# Patient Record
Sex: Male | Born: 1975 | Race: White | Hispanic: No | Marital: Married | Smoking: Former smoker
Health system: Southern US, Community
[De-identification: ages and names within clinical notes are randomized; demographics above are authoritative.]

## PROBLEM LIST (undated history)

## (undated) DIAGNOSIS — J309 Allergic rhinitis, unspecified: Secondary | ICD-10-CM

## (undated) DIAGNOSIS — I10 Essential (primary) hypertension: Secondary | ICD-10-CM

## (undated) DIAGNOSIS — J45909 Unspecified asthma, uncomplicated: Secondary | ICD-10-CM

## (undated) HISTORY — DX: Unspecified asthma, uncomplicated: J45.909

## (undated) HISTORY — DX: Allergic rhinitis, unspecified: J30.9

## (undated) HISTORY — DX: Essential (primary) hypertension: I10

## (undated) HISTORY — PX: WISDOM TOOTH EXTRACTION: SHX21

---

## 2001-11-10 ENCOUNTER — Ambulatory Visit (HOSPITAL_COMMUNITY): Admission: RE | Admit: 2001-11-10 | Discharge: 2001-11-10 | Payer: Self-pay | Admitting: Oral Surgery

## 2007-07-25 ENCOUNTER — Emergency Department (HOSPITAL_COMMUNITY): Admission: EM | Admit: 2007-07-25 | Discharge: 2007-07-25 | Payer: Self-pay | Admitting: Emergency Medicine

## 2010-09-20 NOTE — Op Note (Signed)
[place]  Patient:    Benjamin Dalton, Benjamin Dalton Visit Number: [id number] MRN: [id number]          Service Type: DSU Location: [facility] 2864 [id number] Attending Physician:  Retia Passe Dictated by:   Vania Rea. Warren Danes, D.D.S. Proc. Date: 11/10/01 Admit Date:  11/10/2001 Discharge Date: 11/10/2001                             Operative Report  PREOPERATIVE DIAGNOSIS: 1. Maxillary and mandibular impacted third molar teeth #1, 16, 17, and 32. 2. History of multiple severe drug allergies.  POSTOPERATIVE DIAGNOSES: 1. Maxillary and mandibular impacted third molar teeth #1, 16, 17, and 32. 2. History of multiple severe drug allergies.  PROCEDURE:  Removal of third molar teeth #1, 16, 17, and 32.  ANESTHESIA:  General via oroendotracheal intubation.  ESTIMATED BLOOD LOSS:  Less than 50 cc.  FLUID REPLACEMENT:  Approximately 1000 cc of crystalloid solution.  COMPLICATIONS:  None apparent.  INDICATION FOR PROCEDURE:  Mr. Vallin is a 35 year old gentleman who was referred to my office for removal of his impacted third molar teeth.  The patient presented with a history of multiple severe drug allergies, including anaphylactic reactions to TYLENOL, IBUPROFEN, ASPIRIN, NAPROSYN, and OTHER MEDICATIONS.  Due to this history it was recommended that the teeth be removed under general anesthesia with intubation where the airway could be maintained and allergic reactions reduced.  DESCRIPTION OF PROCEDURE:  On November 10, 2001, Mr. Coven was taken to [facility] major operating suite, where he was placed on the operating room table in a supine position.  Following successful oroendotracheal intubation and general anesthesia, the patients face, neck, and oral cavity were prepped and draped in the usual sterile operating room fashion.  The hypopharynx was suctioned free of fluids and secretions and a moistened two-inch vaginal pack was placed as a throat pack.   Attention was then directed intraorally, where approximately 10 cc of 0.5% Xylocaine containing 1:200,000 epinephrine was infiltrated in the maxillary right and left posterior superior alveolar nerve distributions, the corresponding palatal soft tissues, and the right and left inferior alveolar neurovascular regions.  Attention was then directed to the maxillary right arch, where a 1.5 curvilinear incision was made through the mucoperiosteal tissues over the cortical bone of tooth #1.  The soft tissues were reflected laterally for a distance of approximately 1 cm, exposing the cortical bone.  The cortical bone was then reduced using a Stryker rotary osteotome, and the embedded tooth was identified.  The tooth was then sectioned using a Stryker rotary osteotome and a straight Fisher bur and the tooth subluxated from the alveolus and removed from the oral cavity using an 11A elevator and rongeurs forceps.  Surrounding dental follicular tissue was curetted and removed.  The bony margins were smoothed with a small osseous file.  The area was then irrigated with sterile saline irrigating solution and the mucoperiosteal margins approximated and sutured in an interrupted fashion using 4-0 chromic suture material on an FS2 needle.  In a similar fashion, the remaining third molar teeth were removed as well.  The throat pack was removed and the hypopharynx suctioned free of fluids and secretions.  Mr. Kabler was allowed to awaken from the anesthesia and taken to the recovery room, where he tolerated the procedure well without apparent complication. Dictated by:   Vania Rea. Warren Danes, D.D.S. Attending Physician:  Retia Passe DD:  11/17/01 TD:  11/20/01 Job: [id number]

## 2011-01-27 LAB — URINALYSIS, ROUTINE W REFLEX MICROSCOPIC
Bilirubin Urine: NEGATIVE
Nitrite: NEGATIVE
Specific Gravity, Urine: 1.019
pH: 6

## 2011-01-27 LAB — CBC
Hemoglobin: 16.9
MCHC: 34.5
RBC: 5.66
WBC: 11.5 — ABNORMAL HIGH

## 2011-01-27 LAB — DIFFERENTIAL
Lymphocytes Relative: 20
Lymphs Abs: 2.2
Monocytes Absolute: 0.5
Monocytes Relative: 5
Neutro Abs: 8.5 — ABNORMAL HIGH

## 2011-01-27 LAB — POCT I-STAT CREATININE: Creatinine, Ser: 0.7

## 2016-08-08 DIAGNOSIS — I10 Essential (primary) hypertension: Secondary | ICD-10-CM | POA: Diagnosis not present

## 2016-08-08 DIAGNOSIS — E782 Mixed hyperlipidemia: Secondary | ICD-10-CM | POA: Diagnosis not present

## 2016-08-23 DIAGNOSIS — Z0001 Encounter for general adult medical examination with abnormal findings: Secondary | ICD-10-CM | POA: Diagnosis not present

## 2017-03-07 DIAGNOSIS — E782 Mixed hyperlipidemia: Secondary | ICD-10-CM | POA: Diagnosis not present

## 2017-03-07 DIAGNOSIS — I10 Essential (primary) hypertension: Secondary | ICD-10-CM | POA: Diagnosis not present

## 2017-09-04 DIAGNOSIS — E782 Mixed hyperlipidemia: Secondary | ICD-10-CM | POA: Diagnosis not present

## 2017-09-04 DIAGNOSIS — I10 Essential (primary) hypertension: Secondary | ICD-10-CM | POA: Diagnosis not present

## 2017-09-04 DIAGNOSIS — Z0001 Encounter for general adult medical examination with abnormal findings: Secondary | ICD-10-CM | POA: Diagnosis not present

## 2017-09-11 DIAGNOSIS — Z0001 Encounter for general adult medical examination with abnormal findings: Secondary | ICD-10-CM | POA: Diagnosis not present

## 2017-09-11 DIAGNOSIS — E782 Mixed hyperlipidemia: Secondary | ICD-10-CM | POA: Diagnosis not present

## 2017-09-11 DIAGNOSIS — I10 Essential (primary) hypertension: Secondary | ICD-10-CM | POA: Diagnosis not present

## 2017-09-11 DIAGNOSIS — Z6830 Body mass index (BMI) 30.0-30.9, adult: Secondary | ICD-10-CM | POA: Diagnosis not present

## 2018-05-14 DIAGNOSIS — E782 Mixed hyperlipidemia: Secondary | ICD-10-CM | POA: Diagnosis not present

## 2018-05-14 DIAGNOSIS — K7581 Nonalcoholic steatohepatitis (NASH): Secondary | ICD-10-CM | POA: Diagnosis not present

## 2018-05-14 DIAGNOSIS — I10 Essential (primary) hypertension: Secondary | ICD-10-CM | POA: Diagnosis not present

## 2018-05-27 DIAGNOSIS — Z0001 Encounter for general adult medical examination with abnormal findings: Secondary | ICD-10-CM | POA: Diagnosis not present

## 2018-05-27 DIAGNOSIS — I10 Essential (primary) hypertension: Secondary | ICD-10-CM | POA: Diagnosis not present

## 2018-05-27 DIAGNOSIS — E782 Mixed hyperlipidemia: Secondary | ICD-10-CM | POA: Diagnosis not present

## 2019-02-24 DIAGNOSIS — J329 Chronic sinusitis, unspecified: Secondary | ICD-10-CM | POA: Diagnosis not present

## 2019-02-24 DIAGNOSIS — Z6841 Body Mass Index (BMI) 40.0 and over, adult: Secondary | ICD-10-CM | POA: Diagnosis not present

## 2019-02-24 DIAGNOSIS — J45901 Unspecified asthma with (acute) exacerbation: Secondary | ICD-10-CM | POA: Diagnosis not present

## 2019-03-02 DIAGNOSIS — E782 Mixed hyperlipidemia: Secondary | ICD-10-CM | POA: Diagnosis not present

## 2019-03-02 DIAGNOSIS — Z6831 Body mass index (BMI) 31.0-31.9, adult: Secondary | ICD-10-CM | POA: Diagnosis not present

## 2019-03-02 DIAGNOSIS — I10 Essential (primary) hypertension: Secondary | ICD-10-CM | POA: Diagnosis not present

## 2019-03-22 DIAGNOSIS — Z20828 Contact with and (suspected) exposure to other viral communicable diseases: Secondary | ICD-10-CM | POA: Diagnosis not present

## 2019-03-22 DIAGNOSIS — J069 Acute upper respiratory infection, unspecified: Secondary | ICD-10-CM | POA: Diagnosis not present

## 2019-03-22 DIAGNOSIS — U071 COVID-19: Secondary | ICD-10-CM | POA: Diagnosis not present

## 2019-04-04 DIAGNOSIS — J45909 Unspecified asthma, uncomplicated: Secondary | ICD-10-CM | POA: Diagnosis not present

## 2019-04-04 DIAGNOSIS — J019 Acute sinusitis, unspecified: Secondary | ICD-10-CM | POA: Diagnosis not present

## 2019-06-13 DIAGNOSIS — J069 Acute upper respiratory infection, unspecified: Secondary | ICD-10-CM | POA: Diagnosis not present

## 2019-06-13 DIAGNOSIS — J209 Acute bronchitis, unspecified: Secondary | ICD-10-CM | POA: Diagnosis not present

## 2019-06-13 DIAGNOSIS — Z20828 Contact with and (suspected) exposure to other viral communicable diseases: Secondary | ICD-10-CM | POA: Diagnosis not present

## 2019-06-20 DIAGNOSIS — Z20828 Contact with and (suspected) exposure to other viral communicable diseases: Secondary | ICD-10-CM | POA: Diagnosis not present

## 2019-10-26 ENCOUNTER — Ambulatory Visit: Payer: 59 | Admitting: Allergy & Immunology

## 2020-03-07 ENCOUNTER — Encounter: Payer: Self-pay | Admitting: Allergy & Immunology

## 2020-03-07 ENCOUNTER — Ambulatory Visit (INDEPENDENT_AMBULATORY_CARE_PROVIDER_SITE_OTHER): Payer: 59 | Admitting: Allergy & Immunology

## 2020-03-07 ENCOUNTER — Other Ambulatory Visit: Payer: Self-pay

## 2020-03-07 VITALS — BP 150/100 | HR 81 | Temp 98.7°F | Resp 20 | Ht 77.0 in | Wt 248.4 lb

## 2020-03-07 DIAGNOSIS — J4550 Severe persistent asthma, uncomplicated: Secondary | ICD-10-CM

## 2020-03-07 DIAGNOSIS — J310 Chronic rhinitis: Secondary | ICD-10-CM

## 2020-03-07 MED ORDER — TRELEGY ELLIPTA 100-62.5-25 MCG/INH IN AEPB: 1.0000 | INHALATION_SPRAY | Freq: Every day | RESPIRATORY_TRACT | 5 refills | Status: DC

## 2020-03-07 MED ORDER — BENRALIZUMAB 30 MG/ML ~~LOC~~ SOSY
30.0000 mg | PREFILLED_SYRINGE | SUBCUTANEOUS | Status: AC
  Administered 2020-03-07: 30 mg via SUBCUTANEOUS

## 2020-03-07 MED ORDER — AZELASTINE HCL 0.1 % NA SOLN: NASAL | 5 refills | Status: DC

## 2020-03-07 MED ORDER — ARNUITY ELLIPTA 200 MCG/ACT IN AEPB: 2.0000 | INHALATION_SPRAY | Freq: Two times a day (BID) | RESPIRATORY_TRACT | 5 refills | Status: DC

## 2020-03-07 NOTE — Progress Notes (Signed)
Immunotherapy   Patient Details  Name: Benjamin Dalton MRN: [id number] Date of Birth: [birth year]  03/07/2020  Benjamin Dalton was given a sample Fasenra in office today. Patient waited 30 minutes in office post injection with no local reaction or systemic symptoms. Auvi-Q prescription and training provided.  Consent signed and patient instructions given.   Exie Parody 03/07/2020, 4:28 PM

## 2020-03-07 NOTE — Progress Notes (Signed)
NEW PATIENT  Date of Service/Encounter:  03/08/20  Referring provider: Richardean Dalton, Benjamin G, MD   Assessment:   Severe persistent asthma with acute exacerbation - started on Fasenra today  Elevated AEC (700)   Chronic rhinitis  Poor perceiver   Plan/Recommendations:   1. Severe persistent asthma, uncomplicated - Lung testing was in the 30% range, but it did improve with the albuterol treatments. - We are going to get you started on Fasenra, which is a medication that targets eosinophils. - It can decrease asthma exacerbations and improve lung function. - It is not approved to be used alone, so continue with the Trelegy. - We going to get some labs to look for serious causes of difficult to control asthma. - Daily controller medication(s): Singulair 10mg  daily and Trelegy 100/62.5/25 one puff once daily - Prior to physical activity: albuterol 2 puffs 10-15 minutes before physical activity. - Rescue medications: albuterol 4 puffs every 4-6 hours as needed or albuterol nebulizer one vial every 4-6 hours as needed - Changes during respiratory infections or worsening symptoms: Add on Arnuity 200mcg 2 puffs twice daily for TWO WEEKS. - Asthma control goals:  * Full participation in all desired activities (may need albuterol before activity) * Albuterol use two time or less a week on average (not counting use with activity) * Cough interfering with sleep two time or less a month * Oral steroids no more than once a year * No hospitalizations  2. Chronic rhinitis - We are going to get an environmental allergy test via the blood. - We we will call you in 1 to 2 weeks for the results of the test. - In the meantime, continue with fluticasone 1 spray per nostril daily as well as cetirizine 10 mg daily. - Add on Astelin 1 spray per nostril up to twice daily as needed on particularly bad days (this is a nasal form of cetirizine). - We discussed other methods of controlling to get the results  back.  3. Return in about 4 weeks (around 04/04/2020).   Subjective:   Benjamin Dalton is a 44 y.o. male presenting today for evaluation of  Chief Complaint  Patient presents with  . Asthma  . Allergic Reaction    cough,sneezing,watery eyes, congestion     Benjamin Dalton has a history of the following: Patient Active Problem List   Diagnosis Date Noted  . Severe persistent asthma, uncomplicated 03/08/2020  . Chronic rhinitis 03/08/2020    History obtained from: chart review and patient.  Benjamin Dalton was referred by Benjamin Dalton, Benjamin G, MD.     Benjamin Dalton is a 44 y.o. male presenting for an evaluation of allergies and asthma.   Asthma/Respiratory Symptom History: He has been treating it with Zyrtec, Flonase, Singulair, and albuterol as needed. He also has been using Trelegy one puff once daily. He has been on Trelegy one puff once daily for 6 months. His breathing did not seem to make much of a difference. He has been to the PCP 8 times and he gets antibiotics and steroids with transient improvement. He has never been admitted to the hospital.   Allergic Rhinitis Symptom History: He reports congestion, watery eyes and nose. He also endorses some SOB. It does not completely wipe it out and it keeps coming back. He started having symptoms around 18 months ago. He did not really have symptoms as a kiddo, but maybe some pollen allergies. He would have spring allergies and he would just have some itching and eye  watering. But this is the first time that he has had allergies where a cough is involved. He did not have any relief over the winter.   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 Prior to that, he had worked for [employer] for several years. But it did not get better since changing to a different facility. Symptoms do not seem to change environments at all. He is now working at [employer].   Otherwise, there is no history of other atopic diseases, including food allergies, drug allergies, stinging insect allergies, eczema, urticaria or contact dermatitis. There is no significant infectious history. Vaccinations are up to date.    Past Medical History: Patient Active Problem List   Diagnosis Date Noted  . Severe persistent asthma, uncomplicated 03/08/2020  . Chronic rhinitis 03/08/2020    Medication List:  Allergies as of 03/07/2020   Not on File     Medication List       Accurate as of March 07, 2020 11:59 PM. If you have any questions, ask your nurse or doctor.        STOP taking these medications   Trelegy Ellipta 200-62.5-25 MCG/INH Aepb Generic drug: Fluticasone-Umeclidin-Vilant Replaced by: Trelegy Ellipta 100-62.5-25 MCG/INH Aepb Stopped by: Alfonse Spruce, MD     TAKE these medications   albuterol 108 (90 Base) MCG/ACT inhaler Commonly known as: VENTOLIN HFA Inhale into the lungs every 6 (six) hours as needed for wheezing or shortness of breath.   Arnuity Ellipta 200 MCG/ACT Aepb Generic drug: Fluticasone Furoate Inhale 2 puffs into the lungs in the morning and at bedtime. Started by: Alfonse Spruce, MD   azelastine 0.1 % nasal spray Commonly known as: ASTELIN 1 spray in each nostril 1-2 times daily as needed. Started by: Alfonse Spruce, MD   EPINEPHrine 0.3 mg/0.3 mL Soaj injection Commonly known as: Auvi-Q Inject 0.3 mg into the muscle as needed for anaphylaxis. Started by: Alfonse Spruce, MD   fexofenadine 180 MG tablet Commonly known as: ALLEGRA Take 180 mg by mouth daily.   fluticasone 50 MCG/ACT nasal spray Commonly known as: FLONASE Place into both nostrils daily.   ipratropium 0.06 % nasal spray Commonly known as: ATROVENT Place into both nostrils.   losartan-hydrochlorothiazide 100-12.5 MG tablet Commonly known as: HYZAAR Take 1 tablet by mouth daily.   montelukast 10 MG tablet Commonly  known as: SINGULAIR Take 10 mg by mouth at bedtime.   Trelegy Ellipta 100-62.5-25 MCG/INH Aepb Generic drug: Fluticasone-Umeclidin-Vilant Inhale 1 puff into the lungs daily. Replaces: Trelegy Ellipta 200-62.5-25 MCG/INH Aepb Started by: Alfonse Spruce, MD       Birth History: non-contributory  Developmental History: non-contributory  Past Surgical History: Past Surgical History:  Procedure Laterality Date  . WISDOM TOOTH EXTRACTION       Family History: History reviewed. No pertinent family history.   Social History: Terron lives at home with his family. He lives in a house that is 44 years old. There is hardwood throughout the home. They have a combination of gas and electric heating. There is a cat and a dog in the home. There are no dust mite coverings on the bedding. There is no tobacco exposure at all. He current works for the past 20 years. He works on Sales promotion account executive. He worked at [employer] and now works at [employer] in [place].    Review of  Systems  Constitutional: Negative.  Negative for chills, fever, malaise/fatigue and weight loss.  HENT: Positive for congestion. Negative for ear discharge, ear pain and sinus pain.   Eyes: Negative for pain, discharge and redness.  Respiratory: Positive for cough, shortness of breath and wheezing. Negative for sputum production.   Cardiovascular: Negative.  Negative for chest pain and palpitations.  Gastrointestinal: Negative for abdominal pain, constipation, diarrhea, heartburn, nausea and vomiting.  Skin: Negative.  Negative for itching and rash.  Neurological: Negative for dizziness and headaches.  Endo/Heme/Allergies: Positive for environmental allergies. Does not bruise/bleed easily.  All other systems reviewed and are negative.      Objective:   Blood pressure (!) 150/100, pulse 81, temperature 98.7 F (37.1 C), temperature source Temporal, resp. rate 20, height 6\' 5"  (1.956 m), weight 248 lb 6.4 oz (112.7 kg),  SpO2 93 %. Body mass index is 29.46 kg/m.   Physical Exam:   Physical Exam Constitutional:      Appearance: Normal appearance. He is well-developed.  HENT:     Head: Normocephalic and atraumatic.     Right Ear: Tympanic membrane, ear canal and external ear normal. No drainage, swelling or tenderness. Tympanic membrane is not injected, scarred, erythematous, retracted or bulging.     Left Ear: Tympanic membrane, ear canal and external ear normal. No drainage, swelling or tenderness. Tympanic membrane is not injected, scarred, erythematous, retracted or bulging.     Nose: No nasal deformity, septal deviation, mucosal edema or rhinorrhea.     Right Turbinates: Enlarged, swollen and pale.     Left Turbinates: Enlarged, swollen and pale.     Right Sinus: No maxillary sinus tenderness or frontal sinus tenderness.     Left Sinus: No maxillary sinus tenderness or frontal sinus tenderness.     Mouth/Throat:     Mouth: Mucous membranes are not pale and not dry.     Pharynx: Uvula midline.     Comments: Cobblestoning present in the posterior oropharynx. Eyes:     General:        Right eye: No discharge.        Left eye: No discharge.     Conjunctiva/sclera: Conjunctivae normal.     Right eye: Right conjunctiva is not injected. No chemosis.    Left eye: Left conjunctiva is not injected. No chemosis.    Pupils: Pupils are equal, round, and reactive to light.  Cardiovascular:     Rate and Rhythm: Normal rate and regular rhythm.     Heart sounds: Normal heart sounds.  Pulmonary:     Effort: Tachypnea and accessory muscle usage present. No respiratory distress.     Breath sounds: Decreased breath sounds and wheezing present. No rhonchi or rales.     Comments: Inspiratory and expiratory wheezing throughout. Decreased air movement at the bases. Somewhat increased work of breathing noted.  Chest:     Chest wall: No tenderness.  Abdominal:     Tenderness: There is no abdominal tenderness. There  is no guarding or rebound.  Lymphadenopathy:     Head:     Right side of head: No submandibular, tonsillar or occipital adenopathy.     Left side of head: No submandibular, tonsillar or occipital adenopathy.     Cervical: No cervical adenopathy.  Skin:    Coloration: Skin is not pale.     Findings: No abrasion, erythema, petechiae or rash. Rash is not papular, urticarial or vesicular.     Comments: No eczematous or urticarial lesions noted.  Neurological:     Mental Status: He is alert.  Psychiatric:        Behavior: Behavior is cooperative.      Diagnostic studies:    Spirometry: results abnormal with an FEV in the 35% range and the FVC in the 39% range. There was significant improvement with four puffs of albuterol x2, although it never normalized. The full results will be scanned into the system.      Allergy Studies: lab sent instead          Malachi Bonds, MD [facility]

## 2020-03-07 NOTE — Patient Instructions (Addendum)
1. Severe persistent asthma, uncomplicated - Lung testing was in the 30% range, but it did improve with the albuterol treatments. - We are going to get you started on Fasenra, which is a medication that targets eosinophils. - It can decrease asthma exacerbations and improve lung function. - It is not approved to be used alone, so continue with the Trelegy. - We going to get some labs to look for serious causes of difficult to control asthma. - Daily controller medication(s): Singulair 10mg  daily and Trelegy 100/62.5/25 one puff once daily - Prior to physical activity: albuterol 2 puffs 10-15 minutes before physical activity. - Rescue medications: albuterol 4 puffs every 4-6 hours as needed or albuterol nebulizer one vial every 4-6 hours as needed - Changes during respiratory infections or worsening symptoms: Add on Arnuity 11-[birth year]-1997 2 puffs twice daily for TWO WEEKS. - Asthma control goals:  * Full participation in all desired activities (may need albuterol before activity) * Albuterol use two time or less a week on average (not counting use with activity) * Cough interfering with sleep two time or less a month * Oral steroids no more than once a year * No hospitalizations  2. Chronic rhinitis - We are going to get an environmental allergy test via the blood. - We we will call you in 1 to 2 weeks for the results of the test. - In the meantime, continue with fluticasone 1 spray per nostril daily as well as cetirizine 10 mg daily. - Add on Astelin 1 spray per nostril up to twice daily as needed on particularly bad days (this is a nasal form of cetirizine). - We discussed other methods of controlling to get the results back.  3. Return in about 4 weeks (around 04/04/2020).   Please inform 14/05/2019 of any Emergency Department visits, hospitalizations, or changes in symptoms. Call us before going to the ED for breathing or allergy symptoms since we might be able to fit you in for a sick visit. Feel free  to contact us anytime with any questions, problems, or concerns.  It was a pleasure to meet you today!  Websites that have reliable patient information: 1. American Academy of Asthma, Allergy, and Immunology: www.aaaai.org 2. Food Allergy Research and Education (FARE): foodallergy.org 3. Mothers of Asthmatics: http://www.asthmacommunitynetwork.org 4. American College of Allergy, Asthma, and Immunology: www.acaai.org   COVID-19 Vaccine Information can be found at: [place] For questions related to vaccine distribution or appointments, please email [email or web address] or call [phone].     "Like" [phone] on Facebook and Instagram for our latest updates!     HAPPY FALL!     Make sure you are registered to vote! If you have moved or changed any of your contact information, you will need to get this updated before voting!  In some cases, you MAY be able to register to vote online: [place]

## 2020-03-08 ENCOUNTER — Telehealth: Payer: Self-pay | Admitting: *Deleted

## 2020-03-08 ENCOUNTER — Encounter: Payer: Self-pay | Admitting: Allergy & Immunology

## 2020-03-08 ENCOUNTER — Telehealth: Payer: Self-pay

## 2020-03-08 DIAGNOSIS — J310 Chronic rhinitis: Secondary | ICD-10-CM | POA: Insufficient documentation

## 2020-03-08 DIAGNOSIS — J4550 Severe persistent asthma, uncomplicated: Secondary | ICD-10-CM | POA: Insufficient documentation

## 2020-03-08 MED ORDER — EPINEPHRINE 0.3 MG/0.3ML IJ SOAJ: 0.3000 mg | INTRAMUSCULAR | 1 refills | Status: DC | PRN

## 2020-03-08 NOTE — Telephone Encounter (Signed)
Patient pharmacy Raymon Mutton, pharmacist) called and want to be sure that you want patient on Arnuity (2 puffs, BID;295mcg) and Trelegy (1 puff daily; ) being that they both have fluticasone in them and that he'd be receiving daily. Pharmacist wants to be sure that both medications and dosages are correct and not written or sent in error. Patient is seen by Dr. Dellis Anes  Please advise

## 2020-03-08 NOTE — Telephone Encounter (Signed)
Spoke with pharmacy and clarified prescriptions how and when to take. Pharmacist verbalized understanding.

## 2020-03-08 NOTE — Telephone Encounter (Signed)
That is correct - the Arnuity is ONLY during respiratory flares. I am fine with ONE PUFF twice daily of Arnuity for TWO WEEKS during flares.   The Trelegy should only be once daily EVERY DAY.   Malachi Bonds, MD [facility]

## 2020-03-08 NOTE — Telephone Encounter (Signed)
He needs to use Trelegy 1 puff once a day and during respiratory flares he needs to add on Arnuity 1 puff once a day for 2 weeks and then stop-not 2 puffs twice a day.Please have the patient call Dr. Dellis Anes on Tuesday when he is back in the office to verify the dosing.  Thank you, Wynona Canes

## 2020-03-08 NOTE — Telephone Encounter (Signed)
L/m for patient to contact me to advise process for [facility] and get Rx card info

## 2020-03-08 NOTE — Telephone Encounter (Signed)
-----   Message from Alfonse Spruce, MD sent at 03/08/2020  2:22 PM EDT ----- AEC 700. I gave a sample of Fasenra. Consent signed.

## 2020-03-09 LAB — ASPERGILLUS PRECIPITINS

## 2020-03-09 LAB — ALLERGEN PROFILE, MOLD

## 2020-03-11 LAB — ASPERGILLUS PRECIPITINS

## 2020-03-12 LAB — IGE+ALLERGENS ZONE 2(30)
Alternaria Alternata IgE: <0.1 kU/L
Amer Sycamore IgE Qn: <0.1 kU/L
Aspergillus Fumigatus IgE: <0.1 kU/L
Bahia Grass IgE: <0.1 kU/L
Bermuda Grass IgE: <0.1 kU/L
Cat Dander IgE: <0.1 kU/L
Cedar, Mountain IgE: <0.1 kU/L
Cladosporium Herbarum IgE: <0.1 kU/L
Cockroach, American IgE: <0.1 kU/L
Common Silver Birch IgE: 0.2 kU/L — AB
D Farinae IgE: 0.19 kU/L — AB
D Pteronyssinus IgE: 0.21 kU/L — AB
Dog Dander IgE: <0.1 kU/L
Elm, American IgE: <0.1 kU/L
Hickory, White IgE: <0.1 kU/L
IgE (Immunoglobulin E), Serum: 68 [IU]/mL (ref 6–495)
Johnson Grass IgE: <0.1 kU/L
Maple/Box Elder IgE: <0.1 kU/L
Mucor Racemosus IgE: <0.1 kU/L
Mugwort IgE Qn: <0.1 kU/L
Nettle IgE: <0.1 kU/L
Oak, White IgE: 0.17 kU/L — AB
Penicillium Chrysogen IgE: <0.1 kU/L
Pigweed, Rough IgE: <0.1 kU/L
Plantain, English IgE: <0.1 kU/L
Ragweed, Short IgE: 0.2 kU/L — AB
Sheep Sorrel IgE Qn: <0.1 kU/L
Stemphylium Herbarum IgE: <0.1 kU/L
Sweet gum IgE RAST Ql: <0.1 kU/L
Timothy Grass IgE: <0.1 kU/L
White Mulberry IgE: <0.1 kU/L

## 2020-03-12 LAB — ANCA TITERS
Atypical pANCA: <1:20 {titer}
C-ANCA: <1:20 {titer}
P-ANCA: <1:20 {titer}

## 2020-03-12 LAB — CBC WITH DIFFERENTIAL/PLATELET
Basophils Absolute: 0 x10E3/uL (ref 0.0–0.2)
Basos: 0 %
EOS (ABSOLUTE): 0.7 x10E3/uL — ABNORMAL HIGH (ref 0.0–0.4)
Eos: 7 %
Hematocrit: 51.3 % — ABNORMAL HIGH (ref 37.5–51.0)
Hemoglobin: 17.8 g/dL — ABNORMAL HIGH (ref 13.0–17.7)
Immature Grans (Abs): 0 x10E3/uL (ref 0.0–0.1)
Immature Granulocytes: 0 %
Lymphocytes Absolute: 1.8 x10E3/uL (ref 0.7–3.1)
Lymphs: 18 %
MCH: 29.3 pg (ref 26.6–33.0)
MCHC: 34.7 g/dL (ref 31.5–35.7)
MCV: 85 fL (ref 79–97)
Monocytes Absolute: 0.8 x10E3/uL (ref 0.1–0.9)
Monocytes: 8 %
Neutrophils Absolute: 6.7 x10E3/uL (ref 1.4–7.0)
Neutrophils: 67 %
Platelets: 284 x10E3/uL (ref 150–450)
RBC: 6.07 x10E6/uL — ABNORMAL HIGH (ref 4.14–5.80)
RDW: 12.5 % (ref 11.6–15.4)
WBC: 10 x10E3/uL (ref 3.4–10.8)

## 2020-03-12 LAB — ALLERGEN PROFILE, MOLD
Aureobasidi Pullulans IgE: <0.1 kU/L
Phoma Betae IgE: <0.1 kU/L
Setomelanomma Rostrat: <0.1 kU/L

## 2020-03-12 LAB — ASPERGILLUS PRECIPITINS
Aspergillus Niger Antibodies: NEGATIVE
Aspergillus glaucus IgG: NEGATIVE
Aspergillus nidulans IgG: NEGATIVE
Aspergillus terreus IgG: NEGATIVE

## 2020-03-12 LAB — ALPHA-1-ANTITRYPSIN: A-1 Antitrypsin: 146 mg/dL (ref 101–187)

## 2020-03-14 NOTE — Telephone Encounter (Signed)
L/m for patient to contact me again ?

## 2020-03-22 NOTE — Telephone Encounter (Signed)
Patient has not returned calls regarding Fasenra. At future appt if he wants to proceed with therapy will need to reach out to me to get info from him for approval of same

## 2020-03-23 NOTE — Telephone Encounter (Signed)
That is surprising. I just gave him a call as well and he answered, confirmed who he was, and then he hung up the phone. He did call me back then due to a poor connection.  He said he remains interested in [place].  He felt that it did improve his asthma when he got it.  He is worried about the cost, but I tried to reassure him over the very poor reception most people do not pay anything for it.  He has been working crazy hours at work and is wondering if Tammy can email him instead.  It texted him and he is going to get back to me with his email address (I cannot seem to find it in the chart).      Malachi Bonds, MD [facility]

## 2020-04-02 NOTE — Patient Instructions (Addendum)
Severe persistent asthma Get STAT PA/lateral chest xray Stop using  Arunity inhaler every day Continue Trelegy 1 puff once a day to help prevent cough and wheeze Continue Singulair 10 mg once a day to help prevent cough and wheeze Start Fasenra junctions to help control asthma.  You will need to contact Tammy our [employer] coordinator.  She has tried getting in touch with you. May use albuterol 2 puffs every 4 hours as needed for cough, wheeze, tightness in his chest, or shortness of breath. Also may use albuterol 2 puffs 5 to 15 minutes prior to exercise. For asthma flares start Arnuity 200 mcg 1 puff twice a day for 2 weeks. Asthma control goals:   Full participation in all desired activities (may need albuterol before activity)  Albuterol use two time or less a week on average (not counting use with activity)  Cough interfering with sleep two time or less a month  Oral steroids no more than once a year  No hospitalizations  Seasonal and perennial allergic rhinitis (dust mite, trees, and ragweed) Continue cetirizine 10 mg once a day as needed for runny nose Start fluticasone nasal spray 1 spray each nostril once a day as needed for stuffy nose Start Astelin nasal spray 1 spray each nostril twice a day as needed for runny nose or drainage. Start saline nasal rinses once a day to help with nasal symptoms. Use this prior to any medicated nasal sprays.  Hypertension Your blood pressure was high today in the office. You need to take your blood pressure medications as prescribed. Please contact your primary care physician  Please let us know if this treatment plan is not working well for you. Schedule a follow-up appointment in 4 weeks

## 2020-04-04 ENCOUNTER — Encounter: Payer: Self-pay | Admitting: Family

## 2020-04-04 ENCOUNTER — Ambulatory Visit (HOSPITAL_COMMUNITY)
Admission: RE | Admit: 2020-04-04 | Discharge: 2020-04-04 | Disposition: A | Discharge status: Home / Self Care | Payer: 59 | Source: Ambulatory Visit | Attending: Family | Admitting: Family

## 2020-04-04 ENCOUNTER — Ambulatory Visit (INDEPENDENT_AMBULATORY_CARE_PROVIDER_SITE_OTHER): Payer: 59 | Admitting: Family

## 2020-04-04 ENCOUNTER — Other Ambulatory Visit: Payer: Self-pay

## 2020-04-04 VITALS — BP 148/102 | HR 75 | Resp 18

## 2020-04-04 DIAGNOSIS — J4550 Severe persistent asthma, uncomplicated: Secondary | ICD-10-CM

## 2020-04-04 DIAGNOSIS — R059 Cough, unspecified: Secondary | ICD-10-CM | POA: Insufficient documentation

## 2020-04-04 DIAGNOSIS — J3089 Other allergic rhinitis: Secondary | ICD-10-CM | POA: Diagnosis not present

## 2020-04-04 DIAGNOSIS — J302 Other seasonal allergic rhinitis: Secondary | ICD-10-CM

## 2020-04-04 DIAGNOSIS — I10 Essential (primary) hypertension: Secondary | ICD-10-CM

## 2020-04-04 NOTE — Progress Notes (Signed)
[address] [place] [address] Dept: [phone]  FOLLOW UP NOTE  Patient ID: Benjamin Dalton, male    DOB: Jan 09, 1976  Age: 44 y.o. MRN: [id number] Date of Office Visit: 04/04/2020  Assessment  Chief Complaint: Asthma (no rescue inhaler in the past 3-4 days. Trelegy seems to be helping some. he says that his days are better. he does complain of chest congestion. productive cough with sputum ranging from yellow to green to dark brown. sometimes thin and sometimes hard. )  HPI Benjamin Dalton is a 44 year old male who presents today for follow-up of severe persistent asthma and seasonal and perennial allergic rhinitis.  He was last seen on March 08, 2020 by Dr. Dellis Anes.  Severe persistent asthma is reported as doing a little better with Trelegy 100 mcg 1 puff once a day, Singulair 10 mg once a day, and albuterol as needed.  He has been using Arnuity 200 mcg 1 puff once a day rather than adding it on for asthma/respiratory flares.  He reports a productive cough that is occasionally clear, yellow, green, brown in color. He does dip.  What he coughs up can be soft to hard.  He reports that this has been going on occasionally for the past year and a half.  He also reports wheezing with exertion, shortness of breath with exertion and occasional rest.  He denies any tightness in his chest, fever, chills and nocturnal awakenings.  Since his last office visit he has not required any trips to the emergency room or urgent care due to breathing problems.  He has also not required any systemic steroids.  He has not used his albuterol inhaler in the past 5 to 6 days, prior to that he was using it approximately once a day.  Since his last office visit he was given a Z-Pak for the congestion in his chest and sinus region.  In the past he has had antibiotics and they will only help his congestion for a few weeks and then it comes right back.  Also, he reports that he notified our Biologics  coordinator, Tammy two Fridays ago and has not heard back from her.  Seasonal and perennial allergic rhinitis is reported as not well controlled with cetirizine 10 mg once a day.  He did not start fluticasone nasal spray or Astelin nasal spray.  He will occasionally do a sinus lavage.  He reports occasional rhinorrhea that is normally clear, but can be greenish-yellow in color.  He also reports postnasal drip and occasional nasal congestion.  He denies sinus tenderness or fever and chills.   Drug Allergies:  Allergies  Allergen Reactions  . Ibuprofen Anaphylaxis, Hives and Swelling    Review of Systems: Review of Systems  Constitutional: Negative for chills and fever.  HENT:       Reports occasional clear, green, yellow rhinorrhea, post nasal drip, and occasional nasal congestion. He denies any sinus tenderness  Eyes:       Denies itchy watery eyes  Respiratory: Positive for cough, shortness of breath and wheezing.        Reports off/on productive cough that is yellow/green/brown sputum for the past 1 and a half years. Wheezing with exertion and shortness of breath with exertion and occasional rest.  Cardiovascular: Negative for chest pain and palpitations.  Gastrointestinal: Negative for abdominal pain and heartburn.  Genitourinary: Negative for dysuria.  Neurological: Negative for headaches.  Endo/Heme/Allergies: Positive for environmental allergies.     Physical Exam: BP Marland Kitchen)  148/102 Comment: did not take BP meds & ate [employer] chips  Pulse 75   Resp 18   SpO2 96%    Physical Exam Constitutional:      Appearance: Normal appearance.  HENT:     Head: Normocephalic and atraumatic.     Comments: Pharynx normal. Eyes normal. Ears normal. Nose normal    Right Ear: Tympanic membrane, ear canal and external ear normal.     Left Ear: Tympanic membrane, ear canal and external ear normal.     Nose: Nose normal.     Mouth/Throat:     Mouth: Mucous membranes are moist.  Eyes:      Conjunctiva/sclera: Conjunctivae normal.  Cardiovascular:     Rate and Rhythm: Normal rate and regular rhythm.     Heart sounds: Normal heart sounds.  Pulmonary:     Effort: Pulmonary effort is normal.     Breath sounds: Normal breath sounds.     Comments: Lungs clear to auscultation Musculoskeletal:     Cervical back: Neck supple.  Skin:    General: Skin is warm.  Neurological:     Mental Status: He is alert and oriented to person, place, and time.  Psychiatric:        Mood and Affect: Mood normal.        Behavior: Behavior normal.        Thought Content: Thought content normal.        Judgment: Judgment normal.     Diagnostics: FVC 3.35 L, FEV1 2.94 L.  Predicted FVC 6.48 L, FEV1 5.06 L.  Spirometry indicates moderate restriction.  Status post bronchodilator response shows FVC 3.35 L, FEV1 2.93 L.  Spirometry indicates moderate restriction with no bronchodilator response.  Spirometry is improved from previous spirometry.  Assessment and Plan: 1. Severe persistent asthma, uncomplicated   2. Seasonal and perennial allergic rhinitis   3. Hypertension, unspecified type   4. Cough     No orders of the defined types were placed in this encounter.   Patient Instructions  Severe persistent asthma Get STAT PA/lateral chest xray Stop using  Arunity inhaler every day Continue Trelegy 1 puff once a day to help prevent cough and wheeze Continue Singulair 10 mg once a day to help prevent cough and wheeze Start Fasenra junctions to help control asthma.  You will need to contact Tammy our Biologics coordinator.  She has tried getting in touch with you. May use albuterol 2 puffs every 4 hours as needed for cough, wheeze, tightness in his chest, or shortness of breath. Also may use albuterol 2 puffs 5 to 15 minutes prior to exercise. For asthma flares start Arnuity 200 mcg 1 puff twice a day for 2 weeks. Asthma control goals:   Full participation in all desired activities (may need  albuterol before activity)  Albuterol use two time or less a week on average (not counting use with activity)  Cough interfering with sleep two time or less a month  Oral steroids no more than once a year  No hospitalizations  Seasonal and perennial allergic rhinitis (dust mite, trees, and ragweed) Continue cetirizine 10 mg once a day as needed for runny nose Start fluticasone nasal spray 1 spray each nostril once a day as needed for stuffy nose Start Astelin nasal spray 1 spray each nostril twice a day as needed for runny nose or drainage. Start saline nasal rinses once a day to help with nasal symptoms. Use this prior to any medicated nasal sprays.  Hypertension Your blood pressure was high today in the office. You need to take your blood pressure medications as prescribed. Please contact your primary care physician  Please let us know if this treatment plan is not working well for you. Schedule a follow-up appointment in 4 weeks   Return in about 4 weeks (around 05/02/2020), or if symptoms worsen or fail to improve, for with Dr. Dellis Anes.    Thank you for the opportunity to care for this patient.  Please do not hesitate to contact me with questions.  Nehemiah Settle, FNP [facility]

## 2020-04-04 NOTE — Telephone Encounter (Signed)
Called and reached patient. Advised approval process, copay card and submit to [facility] specialty pharmacy. Advised patient once receives Rx to store in fridge and contact office for appt to come in for self-admin instructions.

## 2020-04-04 NOTE — Progress Notes (Signed)
Please let the patient know that his chest xray is negative. Thank you!  Benjamin Dalton

## 2020-04-26 ENCOUNTER — Other Ambulatory Visit: Payer: Self-pay | Admitting: Allergy & Immunology

## 2020-05-02 ENCOUNTER — Ambulatory Visit: Payer: 59 | Admitting: Allergy & Immunology

## 2020-05-16 ENCOUNTER — Encounter: Payer: Self-pay | Admitting: Allergy & Immunology

## 2020-05-16 ENCOUNTER — Ambulatory Visit (INDEPENDENT_AMBULATORY_CARE_PROVIDER_SITE_OTHER): Payer: 59 | Admitting: Allergy & Immunology

## 2020-05-16 ENCOUNTER — Other Ambulatory Visit: Payer: Self-pay

## 2020-05-16 DIAGNOSIS — J4550 Severe persistent asthma, uncomplicated: Secondary | ICD-10-CM

## 2020-05-16 DIAGNOSIS — J302 Other seasonal allergic rhinitis: Secondary | ICD-10-CM

## 2020-05-16 DIAGNOSIS — J3089 Other allergic rhinitis: Secondary | ICD-10-CM

## 2020-05-16 DIAGNOSIS — U071 COVID-19: Secondary | ICD-10-CM | POA: Diagnosis not present

## 2020-05-16 NOTE — Progress Notes (Signed)
RE: Benjamin Dalton MRN: [id number] DOB: [birth year] Date of Telemedicine Visit: 05/16/2020  Referring provider: Richardean Chimera, MD Primary care provider: Richardean Chimera, MD  Chief Complaint: Asthma   Telemedicine Follow Up Visit via Telephone: I connected with Benjamin Dalton for a follow up on 05/16/20 by telephone and verified that I am speaking with the correct person using two identifiers.   I discussed the limitations, risks, security and privacy concerns of performing an evaluation and management service by telephone and the availability of in person appointments. I also discussed with the patient that there may be a patient responsible charge related to this service. The patient expressed understanding and agreed to pr oceed.  Patient is at work.  Provider is at the office.  Visit start time: 10:22 AM Visit end time: 10:43 AM Insurance consent/check in by: Victorino Dike Medical consent and medical assistant/nurse: Alisa  History of Present Illness:  He is a 45 y.o. male, who is being followed for severe persistent asthma as well as seasonal perennial allergic rhinitis. His previous allergy office visit was in December 2021 with Benjamin Settle, FNP. At that time, he had a CXR that was thankfully normal. He was changed from Arnuity to Trelegy. His spirometry was improved. He seemed to have some problems with believing that he was asthmatic, so he did take some convincing.  For his rhinitis, he was started on Flonase and Astelin.  He was continued on Zyrtec.  His blood pressure was elevated at the office at 148/102; follow up with his PCP was recommended.  Since last visit, he has mostly done well.  He was diagnosed with COVID19 on Monday January 3rd. He went back yesterday to work. He reports that his symptoms were fairly rough. He did not go to the hospital. He is late for the [place]. He had it scheduled right after Christmas but they went out of town. Aside from COVID19, holidays were  good. He was not using the albuterol inhaler at all until he caught COVID. He was doing well with the Trelegy. He remains on this once daily. He is on the Singulair as well as the allergy medications.   Asthma/Respiratory Symptom History: He remains on the [place]. It is doing well overall. He noted that he was havign increased breathing issues whebn he had COVID. With the trilogy in the center, I feel that his symptoms were under good control.  He did not continue prednisone when he got COVID-19.  He did not go to the hospital and had no hypoxia to his knowledge.  He does not have a pulse ox at home  Allergic Rhinitis Symptom History: He is using cetirizine daily. He is using the nasal medications on PRN basis.  He has not regarding antibiotics.   Otherwise, there have been no changes to his past medical history, surgical history, family history, or social history.  Assessment and Plan:  Benjamin Dalton is a 45 y.o. male with:  Severe persistent asthma, uncomplicated - on Fasenra   Elevated AEC (700)    Perennial and seasonal allergic rhinitis (trees, ragweed, dust mites)  Poor perceiver   Recent COVID-19 infection (positive on January 3)   Overall, Benjamin Dalton is doing fairly well all things considered.  Prior to COVID-19, he was under excellent control.  We are going to continue the Trelegy in regard to reschedule his Harrington Challenger so that he does not get behind on that.  He denies any refills today, but I did tell them the pharmacy reach out  to [place] for refills.  He did not have his allergic rhinitis control at this point in time.  Think we can Allergen immunotherapy.  Diagnostics: None.  Medication List:  Current Outpatient Medications  Medication Sig Dispense Refill  . albuterol (VENTOLIN HFA) 108 (90 Base) MCG/ACT inhaler Inhale into the lungs every 6 (six) hours as needed for wheezing or shortness of breath.    Benjamin Dalton azelastine (ASTELIN) 0.1 % nasal spray 1 spray in each nostril 1-2 times daily as  needed. 30 mL 5  . EPINEPHrine (AUVI-Q) 0.3 mg/0.3 mL IJ SOAJ injection Inject 0.3 mg into the muscle as needed for anaphylaxis. 2 each 1  . FASENRA 30 MG/ML SOSY INJECT 1 SYRINGE UNDER THE SKIN AT WEEK 0, 4 AND 8, THEN INJECT 1 SYRINGE EVERY 8 WEEKS THEREAFTER. 1 mL 8  . fexofenadine (ALLEGRA) 180 MG tablet Take 180 mg by mouth daily.    . fluticasone (FLONASE) 50 MCG/ACT nasal spray Place into both nostrils daily.    . Fluticasone Furoate (ARNUITY ELLIPTA) 200 MCG/ACT AEPB Inhale 2 puffs into the lungs in the morning and at bedtime. 60 each 5  . Fluticasone-Umeclidin-Vilant (TRELEGY ELLIPTA) 100-62.5-25 MCG/INH AEPB Inhale 1 puff into the lungs daily. 30 each 5  . ipratropium (ATROVENT) 0.06 % nasal spray Place into both nostrils.    Benjamin Dalton losartan-hydrochlorothiazide (HYZAAR) 100-12.5 MG tablet Take 1 tablet by mouth daily.    . montelukast (SINGULAIR) 10 MG tablet Take 10 mg by mouth at bedtime.     Current Facility-Administered Medications  Medication Dose Route Frequency Provider Last Rate Last Admin  . Benralizumab SOSY 30 mg  30 mg Subcutaneous Q28 days Benjamin Spruce, MD   30 mg at 03/07/20 1633   Allergies: Allergies  Allergen Reactions  . Ibuprofen Anaphylaxis, Hives and Swelling   I reviewed his past medical history, social history, family history, and environmental history and no significant changes have been reported from previous visits.  Review of Systems  Constitutional: Negative for chills, diaphoresis, fatigue and fever.  HENT: Positive for congestion. Negative for ear discharge, ear pain, facial swelling, postnasal drip, rhinorrhea, sinus pressure, sinus pain and sore throat.   Eyes: Negative for pain, discharge and itching.  Respiratory: Positive for cough and shortness of breath. Negative for apnea and chest tightness.   Cardiovascular: Negative for chest pain.  Gastrointestinal: Negative for diarrhea and nausea.  Endocrine: Negative for cold intolerance and heat  intolerance.  Musculoskeletal: Negative for arthralgias and myalgias.  Skin: Negative for rash.  Allergic/Immunologic: Negative for environmental allergies and food allergies.    Objective:  Physical exam not obtained as encounter was done via telephone.   Previous notes and tests were reviewed.  I discussed the assessment and treatment plan with the patient. The patient was provided an opportunity to ask questions and all were answered. The patient agreed with the plan and demonstrated an understanding of the instructions.   The patient was advised to call back or seek an in-person evaluation if the symptoms worsen or if the condition fails to improve as anticipated.  I provided 21 minutes of non-face-to-face time during this encounter.  It was my pleasure to participate in [place]'s care today. Please feel free to contact me with any questions or concerns.   Sincerely,  Benjamin Spruce, MD

## 2020-05-25 ENCOUNTER — Ambulatory Visit: Payer: 59

## 2020-07-06 ENCOUNTER — Other Ambulatory Visit: Payer: Self-pay

## 2020-07-06 ENCOUNTER — Ambulatory Visit (INDEPENDENT_AMBULATORY_CARE_PROVIDER_SITE_OTHER): Payer: 59

## 2020-07-06 DIAGNOSIS — J4550 Severe persistent asthma, uncomplicated: Secondary | ICD-10-CM

## 2020-07-06 MED ORDER — BENRALIZUMAB 30 MG/ML ~~LOC~~ SOSY
30.0000 mg | PREFILLED_SYRINGE | SUBCUTANEOUS | Status: AC
  Administered 2020-07-06 – 2020-08-31 (×3): 30 mg via SUBCUTANEOUS

## 2020-08-03 ENCOUNTER — Ambulatory Visit (INDEPENDENT_AMBULATORY_CARE_PROVIDER_SITE_OTHER): Payer: 59

## 2020-08-03 ENCOUNTER — Other Ambulatory Visit: Payer: Self-pay

## 2020-08-03 DIAGNOSIS — J4550 Severe persistent asthma, uncomplicated: Secondary | ICD-10-CM | POA: Diagnosis not present

## 2020-08-31 ENCOUNTER — Other Ambulatory Visit: Payer: Self-pay

## 2020-08-31 ENCOUNTER — Ambulatory Visit (INDEPENDENT_AMBULATORY_CARE_PROVIDER_SITE_OTHER): Payer: 59

## 2020-08-31 DIAGNOSIS — J4550 Severe persistent asthma, uncomplicated: Secondary | ICD-10-CM | POA: Diagnosis not present

## 2020-09-10 ENCOUNTER — Other Ambulatory Visit: Payer: Self-pay | Admitting: Allergy & Immunology

## 2020-09-28 ENCOUNTER — Ambulatory Visit: Payer: Self-pay

## 2020-10-12 ENCOUNTER — Other Ambulatory Visit: Payer: Self-pay | Admitting: Otolaryngology

## 2020-10-12 DIAGNOSIS — J329 Chronic sinusitis, unspecified: Secondary | ICD-10-CM

## 2020-10-24 ENCOUNTER — Encounter: Payer: Self-pay | Admitting: Allergy & Immunology

## 2020-10-24 ENCOUNTER — Ambulatory Visit: Payer: 59 | Admitting: Allergy & Immunology

## 2020-10-24 ENCOUNTER — Telehealth: Payer: Self-pay

## 2020-10-24 ENCOUNTER — Other Ambulatory Visit: Payer: Self-pay

## 2020-10-24 VITALS — BP 140/90 | HR 78 | Temp 98.3°F | Resp 18 | Ht 77.0 in | Wt 250.6 lb

## 2020-10-24 DIAGNOSIS — J4550 Severe persistent asthma, uncomplicated: Secondary | ICD-10-CM

## 2020-10-24 DIAGNOSIS — J330 Polyp of nasal cavity: Secondary | ICD-10-CM | POA: Diagnosis not present

## 2020-10-24 DIAGNOSIS — J3089 Other allergic rhinitis: Secondary | ICD-10-CM

## 2020-10-24 DIAGNOSIS — J302 Other seasonal allergic rhinitis: Secondary | ICD-10-CM

## 2020-10-24 MED ORDER — MONTELUKAST SODIUM 10 MG PO TABS: 10.0000 mg | ORAL_TABLET | Freq: Every day | ORAL | 5 refills | Status: DC

## 2020-10-24 NOTE — Progress Notes (Signed)
FOLLOW UP  Date of Service/Encounter:  10/24/20   Assessment:   Severe persistent asthma, uncomplicated - on Fasenra   Elevated AEC (700)    Perennial and seasonal allergic rhinitis (trees, ragweed, dust mites)   Poor perceiver    Recent COVID-19 infection (positive on January 2022)  Nasal polyps - minimal today, but he is completing a course of prednisone (followed by Dr. Suszanne Conners)  Plan/Recommendations:    Overall, his Harrington Challenger seems to be doing the job of preventing asthma exacerbations.  However, he has newly diagnosed nasal polyps.  This has necessitated burst of prednisone every 1 to 2 months since diagnosis.  Because of this, I would like to change to Dupixent to help control both his nasal polyposis and his asthma.  He is very open to this but wants to make sure that everything is in line with his insurance before going ahead with this.  Therefore, we will go ahead and submit for Dupixent approval.  Tammy will reach out to discuss.  We will continue him on his Trelegy 1 puff once daily for his asthma with albuterol as needed.   Subjective:   Jhonatan Lomeli is a 45 y.o. male presenting today for follow up of  Chief Complaint  Patient presents with   Asthma    Lymon Kidney has a history of the following: Patient Active Problem List   Diagnosis Date Noted   Seasonal and perennial allergic rhinitis 05/16/2020   COVID-19 05/16/2020   Severe persistent asthma, uncomplicated 03/08/2020   Chronic rhinitis 03/08/2020    History obtained from: chart review and patient.  Ahmani is a 45 y.o. male presenting for a follow up visit.  He was last seen via televisit January 2022.  At that time, he was doing very well.  We continue with the Trelegy and is better.  His allergic rhinitis was under good control.   Asthma/Respiratory Symptom History: He remains on the Trelegy one puff  once daily . Ioane's asthma has been well controlled. He has not required rescue medication,  experienced nocturnal awakenings due to lower respiratory symptoms, nor have activities of daily living been limited. He has required no Emergency Department or Urgent Care visits for his asthma. He has required zero courses of systemic steroids for asthma exacerbations since the last visit. ACT score today is 19, indicating excellent asthma symptom control. Symptoms are highly variable. He can exercise some days without a problem but then he wheezes when he walks.   Allergic Rhinitis Symptom History: He did go to see ENT and he had polyps. He does see Dr. Suszanne Conners and had nasal polyps diagnosed. Surgery was discussed.  He is not a fan of surgery.  He is just finishing a round of prednisone.  Otherwise, there have been no changes to his past medical history, surgical history, family history, or social history.    Review of Systems  Constitutional: Negative.  Negative for chills, fever, malaise/fatigue and weight loss.  HENT:  Positive for congestion and sinus pain. Negative for ear discharge and ear pain.   Eyes:  Negative for pain, discharge and redness.  Respiratory:  Negative for cough, sputum production, shortness of breath and wheezing.   Cardiovascular: Negative.  Negative for chest pain and palpitations.  Gastrointestinal:  Negative for abdominal pain, constipation, diarrhea, heartburn, nausea and vomiting.  Skin: Negative.  Negative for itching and rash.  Neurological:  Negative for dizziness and headaches.  Endo/Heme/Allergies:  Positive for environmental allergies. Does not bruise/bleed easily.  Objective:   Blood pressure 140/90, pulse 78, temperature 98.3 F (36.8 C), temperature source Temporal, resp. rate 18, height 6\' 5"  (1.956 m), weight 250 lb 9.6 oz (113.7 kg), SpO2 95 %. Body mass index is 29.72 kg/m.   Physical Exam:  Physical Exam Constitutional:      Appearance: He is well-developed.  HENT:     Head: Normocephalic and atraumatic.     Right Ear: Tympanic  membrane, ear canal and external ear normal.     Left Ear: Tympanic membrane, ear canal and external ear normal.     Nose: No nasal deformity, septal deviation, mucosal edema or rhinorrhea.     Right Turbinates: Enlarged and swollen.     Left Turbinates: Enlarged and swollen.     Right Sinus: No maxillary sinus tenderness or frontal sinus tenderness.     Left Sinus: No maxillary sinus tenderness or frontal sinus tenderness.     Comments: I do not appreciate any nasal polyps, but he has been on prednisone. Turbinates are certainly quite enlarged, which seems to be his normal.     Mouth/Throat:     Mouth: Mucous membranes are not pale and not dry.     Pharynx: Uvula midline.  Eyes:     General: Lids are normal. Allergic shiner present.        Right eye: No discharge.        Left eye: No discharge.     Conjunctiva/sclera: Conjunctivae normal.     Right eye: Right conjunctiva is not injected. No chemosis.    Left eye: Left conjunctiva is not injected. No chemosis.    Pupils: Pupils are equal, round, and reactive to light.  Cardiovascular:     Rate and Rhythm: Normal rate and regular rhythm.     Heart sounds: Normal heart sounds.  Pulmonary:     Effort: Pulmonary effort is normal. No tachypnea, accessory muscle usage or respiratory distress.     Breath sounds: Normal breath sounds. No wheezing, rhonchi or rales.  Chest:     Chest wall: No tenderness.  Lymphadenopathy:     Cervical: No cervical adenopathy.  Skin:    Coloration: Skin is not pale.     Findings: No abrasion, erythema, petechiae or rash. Rash is not papular, urticarial or vesicular.  Neurological:     Mental Status: He is alert.  Psychiatric:        Behavior: Behavior is cooperative.     Diagnostic studies: FEV1 and FVC are in the mid 50% range, which is better than previous spirometric findings.        , MD  [facility]

## 2020-10-24 NOTE — Telephone Encounter (Signed)
AVS is not showing complete to mail and send to patient. Please print out word document to be mailed to patient.

## 2020-10-25 ENCOUNTER — Encounter: Payer: Self-pay | Admitting: Allergy & Immunology

## 2020-10-26 ENCOUNTER — Telehealth: Payer: Self-pay | Admitting: *Deleted

## 2020-10-26 ENCOUNTER — Ambulatory Visit: Payer: Self-pay

## 2020-10-26 NOTE — Progress Notes (Signed)
 FOLLOW UP  Date of Service/Encounter:  10/24/20   Assessment:   Severe persistent asthma, uncomplicated - on Fasenra   Elevated AEC (700)    Perennial and seasonal allergic rhinitis (trees, ragweed, dust mites)   Poor perceiver    Recent COVID-19 infection (positive on January 2022)  Nasal polyps - minimal today, but he is completing a course of prednisone (followed by Dr. Teoh)  Plan/Recommendations:    Overall, his Fasenra seems to be doing the job of preventing asthma exacerbations.  However, he has newly diagnosed nasal polyps.  This has necessitated burst of prednisone every 1 to 2 months since diagnosis.  Because of this, I would like to change to Dupixent to help control both his nasal polyposis and his asthma.  He is very open to this but wants to make sure that everything is in line with his insurance before going ahead with this.  Therefore, we will go ahead and submit for Dupixent approval.  Tammy will reach out to discuss.  We will continue him on his Trelegy 1 puff once daily for his asthma with albuterol as needed.   Subjective:   Benjamin Dalton is a 45 y.o. male presenting today for follow up of  Chief Complaint  Patient presents with   Asthma    Benjamin Dalton has a history of the following: Patient Active Problem List   Diagnosis Date Noted   Seasonal and perennial allergic rhinitis 05/16/2020   COVID-19 05/16/2020   Severe persistent asthma, uncomplicated 03/08/2020   Chronic rhinitis 03/08/2020    History obtained from: chart review and patient.  Benjamin Dalton is a 45 y.o. male presenting for a follow up visit.  He was last seen via televisit January 2022.  At that time, he was doing very well.  We continue with the Trelegy and is better.  His allergic rhinitis was under good control.   Asthma/Respiratory Symptom History: He remains on the Trelegy one puff  once daily . Benjamin Dalton's asthma has been well controlled. He has not required rescue medication,  experienced nocturnal awakenings due to lower respiratory symptoms, nor have activities of daily living been limited. He has required no Emergency Department or Urgent Care visits for his asthma. He has required zero courses of systemic steroids for asthma exacerbations since the last visit. ACT score today is 19, indicating excellent asthma symptom control. Symptoms are highly variable. He can exercise some days without a problem but then he wheezes when he walks.   Allergic Rhinitis Symptom History: He did go to see ENT and he had polyps. He does see Dr. Teoh and had nasal polyps diagnosed. Surgery was discussed.  He is not a fan of surgery.  He is just finishing a round of prednisone.  Otherwise, there have been no changes to his past medical history, surgical history, family history, or social history.    Review of Systems  Constitutional: Negative.  Negative for chills, fever, malaise/fatigue and weight loss.  HENT:  Positive for congestion and sinus pain. Negative for ear discharge and ear pain.   Eyes:  Negative for pain, discharge and redness.  Respiratory:  Negative for cough, sputum production, shortness of breath and wheezing.   Cardiovascular: Negative.  Negative for chest pain and palpitations.  Gastrointestinal:  Negative for abdominal pain, constipation, diarrhea, heartburn, nausea and vomiting.  Skin: Negative.  Negative for itching and rash.  Neurological:  Negative for dizziness and headaches.  Endo/Heme/Allergies:  Positive for environmental allergies. Does not bruise/bleed easily.        Objective:   Blood pressure 140/90, pulse 78, temperature 98.3 F (36.8 C), temperature source Temporal, resp. rate 18, height 6\' 5"  (1.956 m), weight 250 lb 9.6 oz (113.7 kg), SpO2 95 %. Body mass index is 29.72 kg/m.   Physical Exam:  Physical Exam Constitutional:      Appearance: He is well-developed.  HENT:     Head: Normocephalic and atraumatic.     Right Ear: Tympanic  membrane, ear canal and external ear normal.     Left Ear: Tympanic membrane, ear canal and external ear normal.     Nose: No nasal deformity, septal deviation, mucosal edema or rhinorrhea.     Right Turbinates: Enlarged and swollen.     Left Turbinates: Enlarged and swollen.     Right Sinus: No maxillary sinus tenderness or frontal sinus tenderness.     Left Sinus: No maxillary sinus tenderness or frontal sinus tenderness.     Comments: I do not appreciate any nasal polyps, but he has been on prednisone. Turbinates are certainly quite enlarged, which seems to be his normal.     Mouth/Throat:     Mouth: Mucous membranes are not pale and not dry.     Pharynx: Uvula midline.  Eyes:     General: Lids are normal. Allergic shiner present.        Right eye: No discharge.        Left eye: No discharge.     Conjunctiva/sclera: Conjunctivae normal.     Right eye: Right conjunctiva is not injected. No chemosis.    Left eye: Left conjunctiva is not injected. No chemosis.    Pupils: Pupils are equal, round, and reactive to light.  Cardiovascular:     Rate and Rhythm: Normal rate and regular rhythm.     Heart sounds: Normal heart sounds.  Pulmonary:     Effort: Pulmonary effort is normal. No tachypnea, accessory muscle usage or respiratory distress.     Breath sounds: Normal breath sounds. No wheezing, rhonchi or rales.  Chest:     Chest wall: No tenderness.  Lymphadenopathy:     Cervical: No cervical adenopathy.  Skin:    Coloration: Skin is not pale.     Findings: No abrasion, erythema, petechiae or rash. Rash is not papular, urticarial or vesicular.  Neurological:     Mental Status: He is alert.  Psychiatric:        Behavior: Behavior is cooperative.     Diagnostic studies: FEV1 and FVC are in the mid 50% range, which is better than previous spirometric findings.        , MD  [facility]

## 2020-10-26 NOTE — Telephone Encounter (Signed)
-----   Message from Alfonse Spruce, MD sent at 10/25/2020 10:12 AM EDT ----- Change to Dupixent!

## 2020-10-26 NOTE — Telephone Encounter (Signed)
L/m for patient advising approval and submit to [employer] for Dupixent along with copay card info to be emailed to patient.  Advised instructions for in-office or home admin with instrux to call for appt once Rx arrives, storage and dosing

## 2020-10-26 NOTE — Telephone Encounter (Signed)
Discharge printed out to the nursing printer at [facility]. We <3 Epic upgrades!   Can someone grab the discharge and mail to the patient?   Malachi Bonds, MD [facility]

## 2020-10-29 ENCOUNTER — Other Ambulatory Visit: Payer: Self-pay | Admitting: Allergy & Immunology

## 2020-11-07 ENCOUNTER — Other Ambulatory Visit: Payer: Self-pay | Admitting: Allergy & Immunology

## 2020-11-12 ENCOUNTER — Ambulatory Visit
Admission: RE | Admit: 2020-11-12 | Discharge: 2020-11-12 | Disposition: A | Discharge status: Home / Self Care | Payer: 59 | Source: Ambulatory Visit | Attending: Otolaryngology | Admitting: Otolaryngology

## 2020-11-12 DIAGNOSIS — J329 Chronic sinusitis, unspecified: Secondary | ICD-10-CM

## 2020-11-23 ENCOUNTER — Ambulatory Visit (INDEPENDENT_AMBULATORY_CARE_PROVIDER_SITE_OTHER): Payer: 59

## 2020-11-23 ENCOUNTER — Other Ambulatory Visit: Payer: Self-pay

## 2020-11-23 DIAGNOSIS — J4550 Severe persistent asthma, uncomplicated: Secondary | ICD-10-CM | POA: Diagnosis not present

## 2020-11-23 MED ORDER — DUPILUMAB 300 MG/2ML ~~LOC~~ SOSY
600.0000 mg | PREFILLED_SYRINGE | Freq: Once | SUBCUTANEOUS | Status: AC
  Administered 2020-11-23: 600 mg via SUBCUTANEOUS

## 2020-11-23 NOTE — Progress Notes (Signed)
Immunotherapy   Patient Details  Name: Benjamin Dalton MRN: [id number] Date of Birth: 1975/05/22  11/23/2020  Benjamin Dalton started injections for  Dupixent, received a loading dose of two injections of 300 mg/2 mL, patient will give to himself at home. Patient was given a lesson on how to inject prefilled pen. Following schedule: for Dupixent Frequency:every 2 weeks Epi-Pen:Epi-Pen Available   Consent signed and patient instructions given.   Florence Canner 11/23/2020, 3:46 PM

## 2021-04-15 ENCOUNTER — Encounter: Payer: Self-pay | Admitting: Family Medicine

## 2021-04-15 ENCOUNTER — Other Ambulatory Visit: Payer: Self-pay

## 2021-04-15 ENCOUNTER — Ambulatory Visit: Payer: 59 | Admitting: Family Medicine

## 2021-04-15 VITALS — BP 160/106 | HR 75 | Temp 97.9°F | Resp 18

## 2021-04-15 DIAGNOSIS — J302 Other seasonal allergic rhinitis: Secondary | ICD-10-CM

## 2021-04-15 DIAGNOSIS — J4550 Severe persistent asthma, uncomplicated: Secondary | ICD-10-CM | POA: Diagnosis not present

## 2021-04-15 DIAGNOSIS — J339 Nasal polyp, unspecified: Secondary | ICD-10-CM

## 2021-04-15 DIAGNOSIS — J3089 Other allergic rhinitis: Secondary | ICD-10-CM | POA: Diagnosis not present

## 2021-04-15 NOTE — Progress Notes (Signed)
[address] [place] [address] Dept: [phone]  FOLLOW UP NOTE  Patient ID: Benjamin [place], male    DOB: 06-22-1975  Age: 45 y.o. MRN: [id number] Date of Office Visit: 04/15/2021  Assessment  Chief Complaint: Follow-up  HPI Benjamin Dalton is a 45 year old male who presents the clinic for follow-up visit.  He was last seen in this clinic on 10/24/2020 by Dr. Dellis Anes for evaluation of asthma, allergic rhinitis, and nasal polyposis.  At that time, he was switched from [place] to Dupixent in an effort to control nasal polyposis.  At today's visit, he reports his asthma has been well controlled with no shortness of breath or wheeze with activity or rest.  He does report occasional intermittent cough which is producing clear thin mucus.  He continues Trelegy for control of cough about once a month and has not used albuterol more montelukast over the last several months.  He continues Dupixent once every 2 weeks with no large or local reactions.  He reports a significant decrease in his symptoms of asthma while continuing on Dupixent injections.  Allergic rhinitis is reported as moderately well controlled with occasional nasal congestion.  He denies rhinorrhea, sneezing, and postnasal drainage.  He is not currently taking an antihistamine, Flonase, or using saline nasal rinses.   His last environmental allergy testing was on 04/03/2020 via blood work and was positive to dust mite, tree pollen, and ragweed pollen. Symptoms of nasal polyposis are reported as moderately well controlled with some nasal congestion.  He denies anosmia or ageusia.  He follows Dr. Suszanne Conners, ENT, for evaluation and treatment of nasal polyposis.  His current medications are listed in the chart. Of note, his blood pressure was elevated at 2 separate readings during today's visit.  He has been advised to contact his primary care provider for  management of blood pressure  Drug Allergies:  Allergies  Allergen  Reactions   Ibuprofen Anaphylaxis, Hives and Swelling    Physical Exam: BP (!) 160/106 (BP Location: Left Arm, Patient Position: Sitting, Cuff Size: Normal)   Pulse 75   Temp 97.9 F (36.6 C) (Temporal)   Resp 18   SpO2 98%    Physical Exam Vitals reviewed.  Constitutional:      Appearance: Normal appearance.  HENT:     Head: Normocephalic and atraumatic.     Right Ear: Tympanic membrane normal.     Left Ear: Tympanic membrane normal.     Nose:     Comments: Bilateral nares normal.  Pharynx normal.  Ears normal.  Eyes normal.    Mouth/Throat:     Pharynx: Oropharynx is clear.  Eyes:     Conjunctiva/sclera: Conjunctivae normal.  Cardiovascular:     Rate and Rhythm: Normal rate and regular rhythm.     Heart sounds: Normal heart sounds. No murmur heard. Pulmonary:     Effort: Pulmonary effort is normal.     Breath sounds: Normal breath sounds.     Comments: Lungs clear to auscultation Musculoskeletal:        General: Normal range of motion.     Cervical back: Normal range of motion and neck supple.  Skin:    General: Skin is warm and dry.  Neurological:     Mental Status: He is alert and oriented to person, place, and time.  Psychiatric:        Mood and Affect: Mood normal.        Behavior: Behavior normal.  Thought Content: Thought content normal.        Judgment: Judgment normal.    Diagnostics: FVC 4.05, FEV1 2.98.  Predicted FVC 6.43, predicted FEV1 5.01.  Spirometry indicates restriction.  This is consistent with previous spirometry readings.  Assessment and Plan: 1. Severe persistent asthma, uncomplicated   2. Seasonal and perennial allergic rhinitis   3. Nasal polyposis      Patient Instructions  Asthma Continue albuterol 2 puffs once every 4 hours as needed for cough or wheeze You may use albuterol 2 puffs 5 to 15 minutes before activity to decrease cough or wheeze For asthma flares, begin Trelegy 200-1 puff once a day for 2 weeks or until cough  and wheeze free  Allergic rhinitis Continue allergen avoidance measures directed toward pollens and dust mite as listed below Continue an antihistamine once a day as needed for runny nose or itch. Remember to rotate to a different antihistamine about every 3 months. Some examples of over the counter antihistamines include Zyrtec (cetirizine), Xyzal (levocetirizine), Allegra (fexofenadine), and Claritin (loratidine).  Continue azelastine 2 sprays in each nostril once a day as needed for runny nose or sneeze Ipratropium Continue Flonase 2 sprays in each nostril once a day as needed for stuffy nose.  In the right nostril, point the applicator out toward the right ear. In the left nostril, point the applicator out toward the left ear Consider saline nasal rinses as needed for nasal symptoms. Use this before any medicated nasal sprays for best result  Nasal polyposis Continue Dupixent injections once every 2 weeks Continue Flonase 2 sprays in each nostril once a day to control nasal polyps Continue to follow-up with Dr. Suszanne Conners as recommended  Your blood pressure was elevated at today's visit.  Follow-up with your primary care provider for management of your blood pressure  Call the clinic if this treatment plan is not working well for you.  Follow up in 6 months or sooner if needed.   Return in about 6 months (around 10/14/2021), or if symptoms worsen or fail to improve.    Thank you for the opportunity to care for this patient.  Please do not hesitate to contact me with questions.  Thermon Leyland, FNP [facility]

## 2021-04-15 NOTE — Patient Instructions (Signed)
Asthma Continue albuterol 2 puffs once every 4 hours as needed for cough or wheeze You may use albuterol 2 puffs 5 to 15 minutes before activity to decrease cough or wheeze For asthma flares, begin Trelegy 200-1 puff once a day for 2 weeks or until cough and wheeze free  Allergic rhinitis Continue allergen avoidance measures directed toward pollens and dust mite as listed below Continue an antihistamine once a day as needed for runny nose or itch. Remember to rotate to a different antihistamine about every 3 months. Some examples of over the counter antihistamines include Zyrtec (cetirizine), Xyzal (levocetirizine), Allegra (fexofenadine), and Claritin (loratidine).  Continue azelastine 2 sprays in each nostril once a day as needed for runny nose or sneeze Ipratropium Continue Flonase 2 sprays in each nostril once a day as needed for stuffy nose.  In the right nostril, point the applicator out toward the right ear. In the left nostril, point the applicator out toward the left ear Consider saline nasal rinses as needed for nasal symptoms. Use this before any medicated nasal sprays for best result  Nasal polyposis Continue Dupixent injections once every 2 weeks Continue Flonase 2 sprays in each nostril once a day to control nasal polyps Continue to follow-up with Dr. Suszanne Conners as recommended  Your blood pressure was elevated at today's visit.  Follow-up with your primary care provider for management of your blood pressure  Call the clinic if this treatment plan is not working well for you.  Follow up in 6 months or sooner if needed.  Reducing Pollen Exposure The American Academy of Allergy, Asthma and Immunology suggests the following steps to reduce your exposure to pollen during allergy seasons. Do not hang sheets or clothing out to dry; pollen may collect on these items. Do not mow lawns or spend time around freshly cut grass; mowing stirs up pollen. Keep windows closed at night.  Keep car  windows closed while driving. Minimize morning activities outdoors, a time when pollen counts are usually at their highest. Stay indoors as much as possible when pollen counts or humidity is high and on windy days when pollen tends to remain in the air longer. Use air conditioning when possible.  Many air conditioners have filters that trap the pollen spores. Use a HEPA room air filter to remove pollen form the indoor air you breathe.   Control of Dust Mite Allergen Dust mites play a major role in allergic asthma and rhinitis. They occur in environments with high humidity wherever human skin is found. Dust mites absorb humidity from the atmosphere (ie, they do not drink) and feed on organic matter (including shed human and animal skin). Dust mites are a microscopic type of insect that you cannot see with the naked eye. High levels of dust mites have been detected from mattresses, pillows, carpets, upholstered furniture, bed covers, clothes, soft toys and any woven material. The principal allergen of the dust mite is found in its feces. A gram of dust may contain 1,000 mites and 250,000 fecal particles. Mite antigen is easily measured in the air during house cleaning activities. Dust mites do not bite and do not cause harm to humans, other than by triggering allergies/asthma.  Ways to decrease your exposure to dust mites in your home:  1. Encase mattresses, box springs and pillows with a mite-impermeable barrier or cover  2. Wash sheets, blankets and drapes weekly in hot water (130 F) with detergent and dry them in a dryer on the hot setting.  3. Have the room cleaned frequently with a vacuum cleaner and a damp dust-mop. For carpeting or rugs, vacuuming with a vacuum cleaner equipped with a high-efficiency particulate air (HEPA) filter. The dust mite allergic individual should not be in a room which is being cleaned and should wait 1 hour after cleaning before going into the room.  4. Do not sleep  on upholstered furniture (eg, couches).  5. If possible removing carpeting, upholstered furniture and drapery from the home is ideal. Horizontal blinds should be eliminated in the rooms where the person spends the most time (bedroom, study, television room). Washable vinyl, roller-type shades are optimal.  6. Remove all non-washable stuffed toys from the bedroom. Wash stuffed toys weekly like sheets and blankets above.  7. Reduce indoor humidity to less than 50%. Inexpensive humidity monitors can be purchased at most hardware stores. Do not use a humidifier as can make the problem worse and are not recommended.

## 2021-04-27 IMAGING — DX DG CHEST 2V
2 series · 2 of 2 positions shown · non-contrast
Comparison: None.

CLINICAL DATA: Cough and sinus drainage for 1 year

EXAM:
CHEST - 2 VIEW

[chest pa]
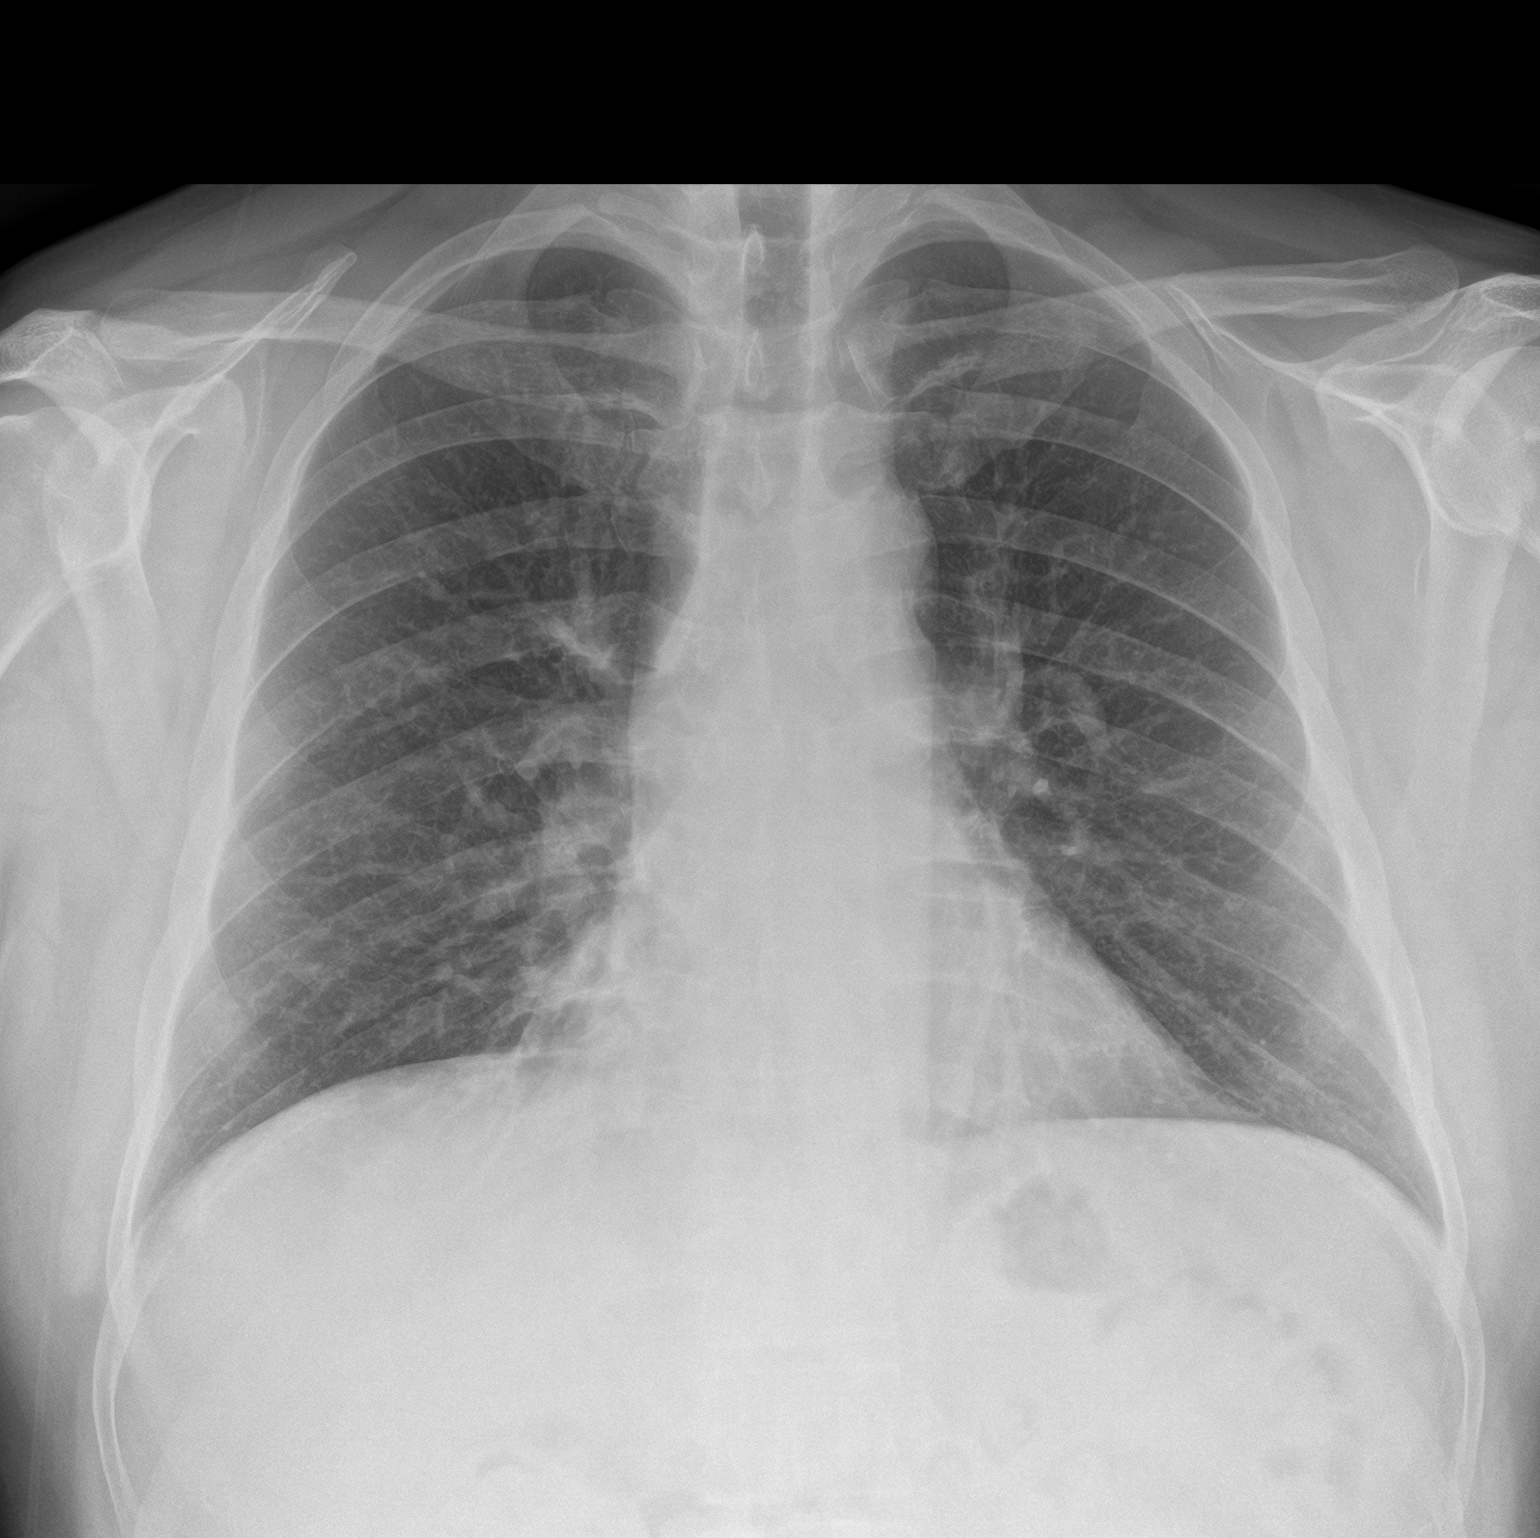

[chest lat]
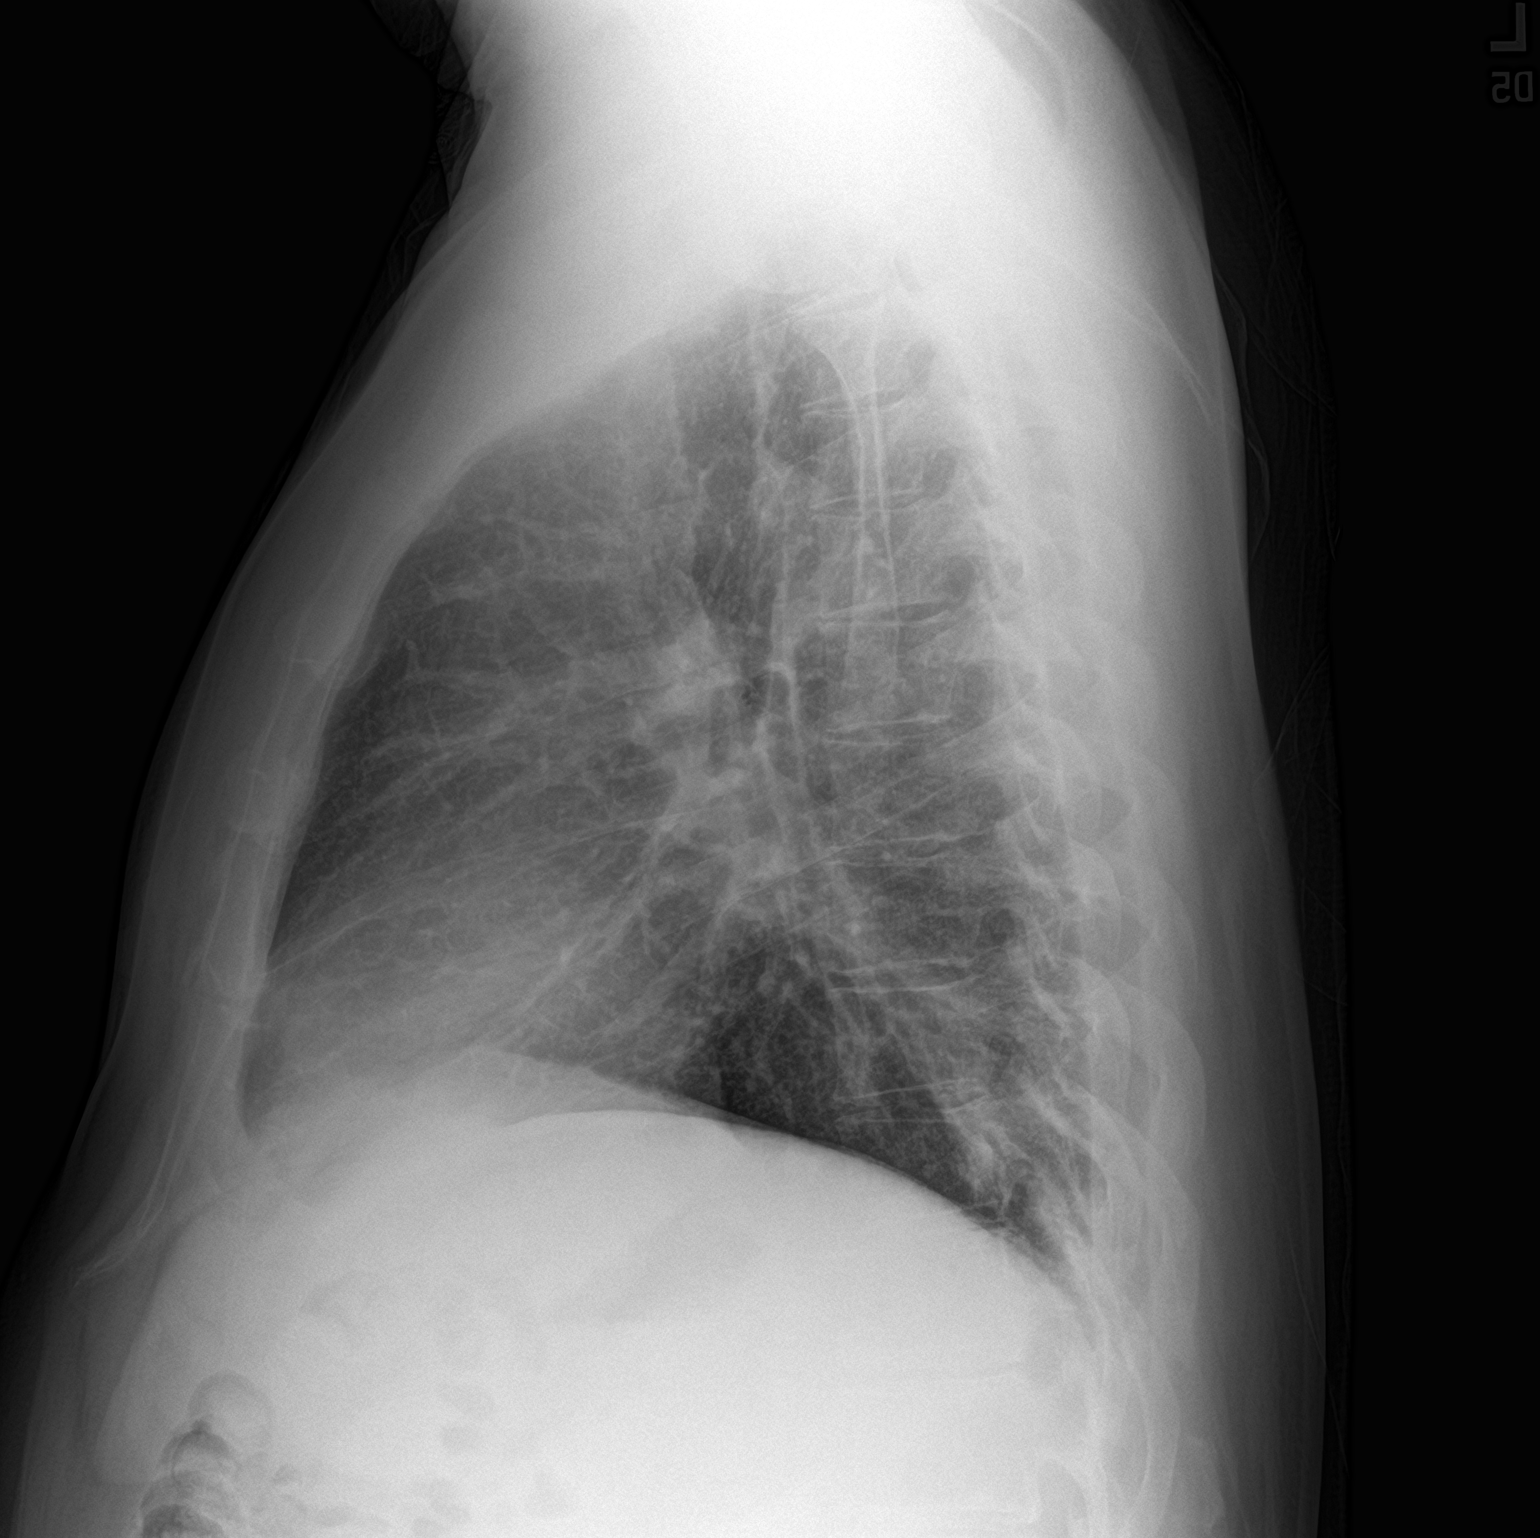

[2 of 2 positions shown; findings below may reference images not displayed]

FINDINGS: Normal heart size and mediastinal contours. Mild prominence of
markings at the bases but no discrete infiltrate or bronchiectasis.
No effusion or pneumothorax. No acute osseous findings.
IMPRESSION: Negative chest.

## 2021-11-06 ENCOUNTER — Ambulatory Visit: Payer: 59 | Admitting: Allergy & Immunology

## 2021-11-15 ENCOUNTER — Ambulatory Visit: Payer: 59 | Admitting: Allergy & Immunology

## 2021-11-15 ENCOUNTER — Other Ambulatory Visit: Payer: Self-pay

## 2021-11-15 ENCOUNTER — Encounter: Payer: Self-pay | Admitting: Allergy & Immunology

## 2021-11-15 VITALS — BP 154/100 | HR 67 | Resp 18

## 2021-11-15 DIAGNOSIS — J339 Nasal polyp, unspecified: Secondary | ICD-10-CM

## 2021-11-15 DIAGNOSIS — J3089 Other allergic rhinitis: Secondary | ICD-10-CM | POA: Diagnosis not present

## 2021-11-15 DIAGNOSIS — J4550 Severe persistent asthma, uncomplicated: Secondary | ICD-10-CM | POA: Diagnosis not present

## 2021-11-15 NOTE — Addendum Note (Signed)
Addended by: Dub Mikes on: 11/15/2021 02:12 PM   Modules accepted: Orders

## 2021-11-15 NOTE — Patient Instructions (Addendum)
1. Severe persistent asthma, uncomplicated - Lung testing was in the 50% range, but I am not going to worry about it since you are feeling fine. - Call us if anything changes in the next week.  - Continue with Dupixent every 2 weeks as you are doing.  - Daily controller medication(s): NOTHING - Prior to physical activity: albuterol 2 puffs 10-15 minutes before physical activity. - Rescue medications: albuterol 4 puffs every 4-6 hours as needed or albuterol nebulizer one vial every 4-6 hours as needed - Changes during respiratory infections or worsening symptoms: Add on Trelegy 1 puff twice daily for TWO WEEKS. - Asthma control goals:  * Full participation in all desired activities (may need albuterol before activity) * Albuterol use two time or less a week on average (not counting use with activity) * Cough interfering with sleep two time or less a month * Oral steroids no more than once a year * No hospitalizations  2. Chronic rhinitis (trees, ragweed, dust mites) - with nasal polyps - You seem to be doing well without medications. - We are not going to make any medication changes.  - Add on Systane eye drops as needed for your dry eyes.   3. Return in about 6 months (around 05/18/2022).   Please inform us of any Emergency Department visits, hospitalizations, or changes in symptoms. Call us before going to the ED for breathing or allergy symptoms since we might be able to fit you in for a sick visit. Feel free to contact us anytime with any questions, problems, or concerns.  It was a pleasure to meet you today!  Websites that have reliable patient information: 1. American Academy of Asthma, Allergy, and Immunology: www.aaaai.org 2. Food Allergy Research and Education (FARE): foodallergy.org 3. Mothers of Asthmatics: http://www.asthmacommunitynetwork.org 4. American College of Allergy, Asthma, and Immunology: www.acaai.org   COVID-19 Vaccine Information can be found at:  PodExchange.nl For questions related to vaccine distribution or appointments, please email [email or web address] or call [phone].     "Like" [place] on Facebook and Instagram for our latest updates!       Make sure you are registered to vote! If you have moved or changed any of your contact information, you will need to get this updated before voting!  In some cases, you MAY be able to register to vote online: [email or web address]

## 2021-11-15 NOTE — Progress Notes (Signed)
FOLLOW UP  Date of Service/Encounter:  11/15/21   Assessment:   Severe persistent asthma, uncomplicated - on Dupixent  Lower lung volumes today   Elevated AEC (700)    Perennial and seasonal allergic rhinitis (trees, ragweed, dust mites)   Poor perceiver    Recent COVID-19 infection (positive on January 2022)   Nasal polyps - minimal today, but he is completing a course of prednisone (followed by Dr. Suszanne Conners)   Barbara Cower is overall doing well with to his asthma polyps.  He has not been using a controller medication at this point and his symptoms are controlled with Dupixent.  His FEV1 and FVC were in the 50 to 60% range today, which is not normal for him.  However, he denies any symptoms and was not really interested in getting albuterol in the office today.  I did give him a Trelegy sample in case his symptoms resume.  He is going to call us with an update.  Plan/Recommendations:   1. Severe persistent asthma, uncomplicated - Lung testing was in the 50% range, but I am not going to worry about it since you are feeling fine. - Call us if anything changes in the next week.  - Continue with Dupixent every 2 weeks as you are doing.  - Daily controller medication(s): NOTHING - Prior to physical activity: albuterol 2 puffs 10-15 minutes before physical activity. - Rescue medications: albuterol 4 puffs every 4-6 hours as needed or albuterol nebulizer one vial every 4-6 hours as needed - Changes during respiratory infections or worsening symptoms: Add on Trelegy 1 puff twice daily for TWO WEEKS. - Asthma control goals:  * Full participation in all desired activities (may need albuterol before activity) * Albuterol use two time or less a week on average (not counting use with activity) * Cough interfering with sleep two time or less a month * Oral steroids no more than once a year * No hospitalizations  2. Chronic rhinitis (trees, ragweed, dust mites) - with nasal polyps - You  seem to be doing well without medications. - We are not going to make any medication changes.  - Add on Systane eye drops as needed for your dry eyes.   3. Return in about 6 months (around 05/18/2022).   Subjective:   Benjamin Dalton is a 46 y.o. male presenting today for follow up of  Chief Complaint  Patient presents with   Asthma    Benjamin Dalton has a history of the following: Patient Active Problem List   Diagnosis Date Noted   Nasal polyposis 04/15/2021   Seasonal and perennial allergic rhinitis 05/16/2020   COVID-19 05/16/2020   Severe persistent asthma, uncomplicated 03/08/2020   Chronic rhinitis 03/08/2020    History obtained from: chart review and patient.  Benjamin Dalton is a 46 y.o. male presenting for a follow up visit.  He was last seen in December 2022 by Thermon Leyland, one of our esteemed nurse practitioners.  At that time, he was continued on albuterol as needed and Trelegy added during respiratory flares.  We have changed him from [place] to Dupixent in June 2022 due to a new diagnosis of nasal polyps.  For his allergic rhinitis, he was continued on an over-the-counter antihistamine as needed.  He was also continued on Astelin and Flonase.  He continues to follow with Dr. Suszanne Conners for nasal polyps.  Since last visit, he has done well.   Asthma/Respiratory Symptom History: Asthma has been "non-existent".  He has not used  his Trelegy in quite some time. He does not have an up to date albuterol at all. He has not been on prednisone in quite some time.   Allergic Rhinitis Symptom History: His rhinitis symptoms have been under good control. He does report that his eyes have been rather more dry since starting the Dupixent. He does not have  any eye drops to try at all. He does have some congestion around 2-3 hours per month. His nasal polyps have been under good control. He has not seen Dr. Suszanne Conners again in quite some time. His last prednisone taper was in April 2023 for a sinus infection.  This was the first one that he has had since starting Dupixent. He did get antibiotics with this as well.   He is doing well at work. He is thankfully working fewer hours. He was previously working 12 12-hour days, but now he has weekends off. He is thankful for this. They went to the beach recently.   Otherwise, there have been no changes to his past medical history, surgical history, family history, or social history.    Review of Systems  Constitutional: Negative.  Negative for chills, fever, malaise/fatigue and weight loss.  HENT: Negative.  Negative for congestion, ear discharge, ear pain and nosebleeds.   Eyes:  Negative for pain, discharge and redness.  Respiratory:  Negative for cough, sputum production, shortness of breath and wheezing.   Cardiovascular: Negative.  Negative for chest pain and palpitations.  Gastrointestinal:  Negative for abdominal pain, constipation, diarrhea, heartburn, nausea and vomiting.  Skin: Negative.  Negative for itching and rash.  Neurological:  Negative for dizziness and headaches.  Endo/Heme/Allergies:  Negative for environmental allergies. Does not bruise/bleed easily.       Objective:   Blood pressure (!) 154/100, pulse 67, resp. rate 18, SpO2 97 %. There is no height or weight on file to calculate BMI.    Physical Exam Vitals reviewed.  Constitutional:      Appearance: He is well-developed.  HENT:     Head: Normocephalic and atraumatic.     Right Ear: Tympanic membrane, ear canal and external ear normal.     Left Ear: Tympanic membrane, ear canal and external ear normal.     Nose: No nasal deformity, septal deviation, mucosal edema or rhinorrhea.     Right Turbinates: Enlarged and swollen.     Left Turbinates: Enlarged and swollen.     Right Sinus: No maxillary sinus tenderness or frontal sinus tenderness.     Left Sinus: No maxillary sinus tenderness or frontal sinus tenderness.     Comments: I do not appreciate any nasal polyps,  but he has been on prednisone. Turbinates are certainly quite enlarged, which seems to be his normal.     Mouth/Throat:     Mouth: Mucous membranes are not pale and not dry.     Pharynx: Uvula midline.  Eyes:     General: Lids are normal. Allergic shiner present.        Right eye: No discharge.        Left eye: No discharge.     Conjunctiva/sclera: Conjunctivae normal.     Right eye: Right conjunctiva is not injected. No chemosis.    Left eye: Left conjunctiva is not injected. No chemosis.    Pupils: Pupils are equal, round, and reactive to light.  Cardiovascular:     Rate and Rhythm: Normal rate and regular rhythm.     Heart sounds: Normal heart sounds.  Pulmonary:     Effort: Pulmonary effort is normal. No tachypnea, accessory muscle usage or respiratory distress.     Breath sounds: Normal breath sounds. No wheezing, rhonchi or rales.  Chest:     Chest wall: No tenderness.  Lymphadenopathy:     Cervical: No cervical adenopathy.  Skin:    Coloration: Skin is not pale.     Findings: No abrasion, erythema, petechiae or rash. Rash is not papular, urticarial or vesicular.  Neurological:     Mental Status: He is alert.  Psychiatric:        Behavior: Behavior is cooperative.      Diagnostic studies:    Spirometry: results abnormal (FEV1: 2.86/57%, FVC: 3.59/56%, FEV1/FVC: 80%).    Spirometry consistent with possible restrictive disease.   Allergy Studies: none        Malachi Bonds, MD  [facility]

## 2021-11-18 ENCOUNTER — Telehealth: Payer: Self-pay

## 2021-11-18 NOTE — Telephone Encounter (Addendum)
-----   Message from Alfonse Spruce, MD sent at 11/15/2021  1:38 PM EDT ----- Can someone call to see how he is doing on Monday?    Called patient = LMOVM to message provider via myChart or contact office with how he's feeling today.  Forwarding message to provider as update.

## 2021-11-19 ENCOUNTER — Other Ambulatory Visit: Payer: Self-pay | Admitting: *Deleted

## 2021-11-19 MED ORDER — DUPIXENT 300 MG/2ML ~~LOC~~ SOSY: 300.0000 mg | PREFILLED_SYRINGE | SUBCUTANEOUS | 11 refills | Status: DC

## 2022-05-21 ENCOUNTER — Encounter: Payer: Self-pay | Admitting: Allergy & Immunology

## 2022-05-21 ENCOUNTER — Ambulatory Visit: Payer: 59 | Admitting: Allergy & Immunology

## 2022-05-21 VITALS — BP 166/100 | HR 76 | Temp 98.2°F | Resp 18 | Ht 77.0 in | Wt 265.2 lb

## 2022-05-21 DIAGNOSIS — J3089 Other allergic rhinitis: Secondary | ICD-10-CM

## 2022-05-21 DIAGNOSIS — J339 Nasal polyp, unspecified: Secondary | ICD-10-CM

## 2022-05-21 DIAGNOSIS — J302 Other seasonal allergic rhinitis: Secondary | ICD-10-CM

## 2022-05-21 DIAGNOSIS — J4550 Severe persistent asthma, uncomplicated: Secondary | ICD-10-CM

## 2022-05-21 NOTE — Patient Instructions (Addendum)
1. Severe persistent asthma, uncomplicated - Lung testing was stable in the mid 50% range.  - Continue with Dupixent every 2 weeks as you are doing.  - Daily controller medication(s): NOTHING - Prior to physical activity: albuterol 2 puffs 10-15 minutes before physical activity. - Rescue medications: albuterol 4 puffs every 4-6 hours as needed or albuterol nebulizer one vial every 4-6 hours as needed - Changes during respiratory infections or worsening symptoms: Add on Trelegy 249mcg 1 puff twice daily for TWO WEEKS. - Asthma control goals:  * Full participation in all desired activities (may need albuterol before activity) * Albuterol use two time or less a week on average (not counting use with activity) * Cough interfering with sleep two time or less a month * Oral steroids no more than once a year * No hospitalizations  2. Chronic rhinitis (trees, ragweed, dust mites) - with nasal polyps - You seem to be doing well without medications. - Continue with Dupixent.   3. Return in about 1 year (around 05/22/2023).   Please inform us of any Emergency Department visits, hospitalizations, or changes in symptoms. Call us before going to the ED for breathing or allergy symptoms since we might be able to fit you in for a sick visit. Feel free to contact us anytime with any questions, problems, or concerns.  It was a pleasure to meet you today!  Websites that have reliable patient information: 1. American Academy of Asthma, Allergy, and Immunology: www.aaaai.org 2. Food Allergy Research and Education (FARE): foodallergy.org 3. Mothers of Asthmatics: http://www.asthmacommunitynetwork.org 4. American College of Allergy, Asthma, and Immunology: www.acaai.org   COVID-19 Vaccine Information can be found at: [email or web address].co.uk For questions related to vaccine distribution or appointments, please email [email or web address] or call  [phone].     "Like" [place] on Facebook and Instagram for our latest updates!       Make sure you are registered to vote! If you have moved or changed any of your contact information, you will need to get this updated before voting!  In some cases, you MAY be able to register to vote online: [email or web address]

## 2022-05-21 NOTE — Progress Notes (Signed)
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 Suszanne Conners)    Plan/Recommendations:   1. Severe persistent asthma, uncomplicated - Lung testing was stable in the mid 50% range.  - Continue with Dupixent every 2 weeks as you are doing.  - Daily controller medication(s): NOTHING - Prior to physical activity: albuterol 2 puffs 10-15 minutes before physical activity. - Rescue medications: albuterol 4 puffs every 4-6 hours as needed or albuterol nebulizer one vial every 4-6 hours as needed - Changes during respiratory infections or worsening symptoms: Add on Trelegy 1 puff twice daily for TWO WEEKS. - Asthma control goals:  * Full participation in all desired activities (may need albuterol before activity) * Albuterol use two time or less a week on average (not counting use with activity) * Cough interfering with sleep two time or less a month * Oral steroids no more than once a year * No hospitalizations  2. Chronic rhinitis (trees, ragweed, dust mites) - with nasal polyps - You seem to be doing well without medications. - Continue with Dupixent.   3. Return in about 1 year (around 05/22/2023).  Subjective:   Benjamin Dalton is a 47 y.o. male presenting today for follow up of  Chief Complaint  Patient presents with   Asthma    Had the flu a couple weeks and is having respiratory issues since then. Has been using albuterol since he had the flu. Mostly having drainage    Benjamin Dalton has a history of the  following: Patient Active Problem List   Diagnosis Date Noted   Nasal polyposis 04/15/2021   Seasonal and perennial allergic rhinitis 05/16/2020   COVID-19 05/16/2020   Severe persistent asthma, uncomplicated 03/08/2020   Chronic rhinitis 03/08/2020    History obtained from: chart review and patient.  Benjamin Dalton is a 47 y.o. male presenting for a follow up visit.  He was last seen in 2023.  At that time, his lung testing was in the 50% range but this is baseline for him.  We continued with Dupixent every 2 weeks, which she is getting for his nasal polyps.  He also has a Trelegy to use when he is sick.  For his chronic rhinitis with nasal polyps, he seems to be doing very well.  We continue with Systane eyedrops as needed.  Since the last visit, he has done well.   Asthma/Respiratory Symptom History: He is getting over the flu and he has a lot of coughing. It has been over one year since he used an inhaler. He used it during the flu and actually missed a week of work.  He was on Tamiflu for the flu. He has no SOB with normal physical activity. As with previous visits, he is not really reporting any breathing issues at all. He has not been using his rescue inhaler aside from the last couple of weeks. He has not needed Trelegy in a long time at all and really is not interested in taking something every day for his  symptoms.   Allergic Rhinitis Symptom History: He has done well since the last visit. He lets it run its course and does not really ever request or need antibiotics.  He has not had prednisone or antibiotics at all since we saw him last. He does not see Dr. Benjamine Mola on a routine basis; he thinks that he might have seen him sometime during July. He has been on Dupixent since July 2022. He has felt very good since starting [place] and is very happy that he made the decision to start it.   Work continues to be busy. He has some travel plans this year, although nothing firm yet. He tells me that he  uses all of his time off to travel with his family.   Otherwise, there have been no changes to his past medical history, surgical history, family history, or social history.    Review of Systems  Constitutional: Negative.  Negative for chills, fever, malaise/fatigue and weight loss.  HENT: Negative.  Negative for congestion, ear discharge, ear pain and nosebleeds.   Eyes:  Negative for pain, discharge and redness.  Respiratory:  Negative for cough, sputum production, shortness of breath and wheezing.   Cardiovascular: Negative.  Negative for chest pain and palpitations.  Gastrointestinal:  Negative for abdominal pain, constipation, diarrhea, heartburn, nausea and vomiting.  Skin: Negative.  Negative for itching and rash.  Neurological:  Negative for dizziness and headaches.  Endo/Heme/Allergies:  Negative for environmental allergies. Does not bruise/bleed easily.       Objective:   Blood pressure (!) 166/100, pulse 76, temperature 98.2 F (36.8 C), resp. rate 18, height 6\' 5"  (1.956 m), weight 265 lb 4 oz (120.3 kg), SpO2 95 %. Body mass index is 31.45 kg/m.    Physical Exam Vitals reviewed.  Constitutional:      Appearance: Normal appearance. He is well-developed.  HENT:     Head: Normocephalic and atraumatic.     Right Ear: Tympanic membrane, ear canal and external ear normal.     Left Ear: Tympanic membrane, ear canal and external ear normal.     Nose: No nasal deformity, septal deviation, mucosal edema or rhinorrhea.     Right Turbinates: Enlarged, swollen and pale.     Left Turbinates: Enlarged, swollen and pale.     Right Sinus: No maxillary sinus tenderness or frontal sinus tenderness.     Left Sinus: No maxillary sinus tenderness or frontal sinus tenderness.     Comments: There might be some minimal polypoid tissue in the left nasal cavity, but certainly it seems clear compared to previous visits especially before starting [place].     Mouth/Throat:     Mouth:  Mucous membranes are not pale and not dry.     Pharynx: Uvula midline.  Eyes:     General: Lids are normal. Allergic shiner present.        Right eye: No discharge.        Left eye: No discharge.     Conjunctiva/sclera: Conjunctivae normal.     Right eye: Right conjunctiva is not injected. No chemosis.    Left eye: Left conjunctiva is not injected. No chemosis.    Pupils: Pupils are equal, round, and reactive to light.  Cardiovascular:     Rate and Rhythm: Normal rate and regular rhythm.     Heart sounds: Normal heart sounds.  Pulmonary:     Effort: Pulmonary effort is normal. No tachypnea, accessory muscle usage or respiratory distress.  Breath sounds: Normal breath sounds. No wheezing, rhonchi or rales.  Chest:     Chest wall: No tenderness.  Lymphadenopathy:     Cervical: No cervical adenopathy.  Skin:    Coloration: Skin is not pale.     Findings: No abrasion, erythema, petechiae or rash. Rash is not papular, urticarial or vesicular.  Neurological:     Mental Status: He is alert.  Psychiatric:        Behavior: Behavior is cooperative.      Diagnostic studies:    Spirometry: results abnormal (FEV1: 2.61/53%, FVC: 3.46/54%, FEV1/FVC: 75%).    Spirometry consistent with possible restrictive disease. Overall this is stable compared to previous spirometric findings.   Allergy Studies: none      Salvatore Marvel, MD  [facility]

## 2022-10-15 ENCOUNTER — Other Ambulatory Visit: Payer: Self-pay | Admitting: *Deleted

## 2022-10-15 MED ORDER — DUPIXENT 300 MG/2ML ~~LOC~~ SOSY: 300.0000 mg | PREFILLED_SYRINGE | SUBCUTANEOUS | 11 refills | Status: DC

## 2022-11-15 ENCOUNTER — Other Ambulatory Visit: Payer: Self-pay

## 2022-11-15 ENCOUNTER — Encounter (HOSPITAL_COMMUNITY): Payer: Self-pay

## 2022-11-15 ENCOUNTER — Emergency Department (HOSPITAL_COMMUNITY)
Admission: EM | Admit: 2022-11-15 | Discharge: 2022-11-16 | Discharge status: Against Medical Advice | Payer: 59 | Attending: Emergency Medicine | Admitting: Emergency Medicine

## 2022-11-15 DIAGNOSIS — Z5321 Procedure and treatment not carried out due to patient leaving prior to being seen by health care provider: Secondary | ICD-10-CM | POA: Diagnosis not present

## 2022-11-15 DIAGNOSIS — R040 Epistaxis: Secondary | ICD-10-CM | POA: Diagnosis not present

## 2022-11-15 MED ORDER — OXYMETAZOLINE HCL 0.05 % NA SOLN
1.0000 | Freq: Once | NASAL | Status: AC
  Administered 2022-11-15: 1 via NASAL
  Filled 2022-11-15: qty 30

## 2022-11-15 NOTE — ED Triage Notes (Signed)
Pt/wife reports  Nose bleed Bent over to pick up a cat bowl and it started bleeding

## 2023-10-06 ENCOUNTER — Other Ambulatory Visit: Payer: Self-pay | Admitting: Allergy & Immunology

## 2024-02-26 ENCOUNTER — Encounter: Payer: Self-pay | Admitting: Allergy & Immunology

## 2024-02-26 ENCOUNTER — Ambulatory Visit: Admitting: Allergy & Immunology

## 2024-02-26 VITALS — BP 142/84 | HR 71 | Temp 97.8°F | Ht 77.0 in | Wt 273.8 lb

## 2024-02-26 DIAGNOSIS — J4550 Severe persistent asthma, uncomplicated: Secondary | ICD-10-CM

## 2024-02-26 DIAGNOSIS — J302 Other seasonal allergic rhinitis: Secondary | ICD-10-CM | POA: Diagnosis not present

## 2024-02-26 DIAGNOSIS — J339 Nasal polyp, unspecified: Secondary | ICD-10-CM

## 2024-02-26 DIAGNOSIS — J3089 Other allergic rhinitis: Secondary | ICD-10-CM

## 2024-02-26 NOTE — Progress Notes (Signed)
 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 Severe persistent asthma, uncomplicated - Lung testing was stable in the mid 60%.  - We can consider changing to Tezspire to see if this helps (injection every month). - Information sent via MyChart about this medication.  - Daily controller medication(s): NOTHING - Prior to physical activity: albuterol 2 puffs 10-15 minutes before physical activity. - Rescue medications: albuterol 4 puffs every 4-6 hours as needed or albuterol nebulizer one vial every 4-6 hours as needed - Changes during respiratory infections or worsening symptoms: Add on Trelegy 200mcg 1 puff twice daily for TWO WEEKS. - Asthma control goals:  * Full participation in all desired activities (may need albuterol before activity) * Albuterol use two time or less a week on average (not counting use with activity) * Cough interfering with sleep two time or less a month * Oral steroids no more than once a year * No hospitalizations  2. Chronic rhinitis (trees, ragweed, dust mites) - with nasal polyps - You seem to be doing well without medications. - Continue with Dupixent , unless you want to change to Tezspire.   3. Return in about 1 year (around 02/25/2025). You can have the follow up appointment with Dr. Iva or a Nurse Practicioner (our Nurse Practitioners are excellent and always have Physician oversight!).    Subjective:   Benjamin Dalton is a 48 y.o. male presenting today for follow up of  Chief Complaint  Patient presents with   Nasal Polyps    Pt is here to f/u and get his  dupixent  renewed/ restart therapy.    Asthma    Pt states he has not had any issues. Currently he is not using or needing asthma medications.     Benjamin Dalton has a history of the following: Patient Active Problem List   Diagnosis Date Noted   Nasal polyposis 04/15/2021   Seasonal and perennial allergic rhinitis 05/16/2020   COVID-19 05/16/2020   Severe persistent asthma, uncomplicated (HCC) 03/08/2020   Chronic rhinitis 03/08/2020    History obtained from: chart review and patient.  Discussed the use of AI scribe software for clinical note transcription with the patient and/or guardian, who gave verbal consent to proceed.  Benjamin Dalton is a 48 y.o. male presenting for a follow up visit.  He was last seen in January 2024.  At that time, his lung testing was stable in the mid 50% range.  We continue with Dupixent  every 2 weeks.  He had Trelegy that he added during flares, but declines to use it daily medication to control his asthma since he was symptomatically doing well.  For his rhinitis, he was doing well with Dupixent  only.  He has sensitization to trees, ragweed, and dust mites.  Since last visit, he has done well.  Asthma/Respiratory Symptom History: Benjamin Dalton's asthma has been well controlled. He has not required rescue medication, experienced nocturnal awakenings due to lower respiratory symptoms, nor have activities of daily living been limited. He has required no Emergency Department or Urgent Care visits for his asthma. He has required zero courses of systemic steroids for  asthma exacerbations since the last visit. ACT score today is 25, indicating excellent asthma symptom control. He has not been on Trelegy at all. He has not been having any breathing problems at all. Spirometry looks a bit better today.   Allergic Rhinitis Symptom History: He has been on Dupixent  for nasal polyps and attempted to extend the interval between doses. He tried to stretch the interval to a month but only  managed three weeks before symptoms worsened. When adhering to the two-week dosing schedule, his symptoms are well-controlled, although he occasionally forgets and delays the dose by a few days. In July of the previous year, he visited the ER due to severe epistaxis after bending over to pick up a cat bowl. The bleeding was profuse and lasted about 30 minutes, eventually subsiding after receiving Afrin in the ER. He was discharged after the bleeding stopped. He experiences nasal congestion, particularly with weather changes and pollen exposure. 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   Infection Symptom History: He has not experienced recent sinus infections significant enough to seek medical attention, although he has brief episodes that resolve on his own. He does not use nasal sprays and dislikes taking medications, which he attributes to his childhood medical history.  Otherwise, there have been no changes to his past medical history, surgical history, family history, or social history.    Review of systems otherwise negative other than that mentioned in the HPI.    Objective:   Blood pressure (!) 142/84, pulse 71, temperature 97.8 F (36.6 C), temperature source Temporal, height 6' 5 (1.956 m), weight 273 lb 12.8 oz (124.2 kg), SpO2 99%. Body mass index is 32.47 kg/m.    Physical Exam Vitals reviewed.  Constitutional:      Appearance: Normal appearance. He is well-developed.  HENT:     Head: Normocephalic and atraumatic.     Right Ear: Tympanic membrane, ear canal and external ear normal.     Left Ear: Tympanic membrane, ear canal and external ear normal.     Nose: No nasal deformity, septal deviation, mucosal edema or rhinorrhea.     Right Turbinates: Enlarged, swollen and pale.     Left Turbinates: Enlarged, swollen and pale.     Right Sinus: No maxillary  sinus tenderness or frontal sinus tenderness.     Left Sinus: No maxillary sinus tenderness or frontal sinus tenderness.     Comments: There might be some minimal polypoid tissue in the left nasal cavity, but certainly it seems clear compared to previous visits especially before starting Dupixent .     Mouth/Throat:     Lips: Pink.     Mouth: Mucous membranes are moist. Mucous membranes are not pale and not dry.     Pharynx: Uvula midline.  Eyes:     General: Lids are normal. Allergic shiner present.        Right eye: No discharge.        Left eye: No discharge.     Conjunctiva/sclera: Conjunctivae normal.     Right eye: Right conjunctiva is not injected. No chemosis.    Left eye: Left conjunctiva is not injected. No chemosis.    Pupils: Pupils are equal, round, and reactive to light.  Cardiovascular:     Rate and Rhythm: Normal rate and regular rhythm.     Heart sounds: Normal heart sounds.  Pulmonary:  Effort: Pulmonary effort is normal. No tachypnea, accessory muscle usage or respiratory distress.     Breath sounds: Normal breath sounds. No wheezing, rhonchi or rales.     Comments: Moving air well in all lung fields. No increased work of breathing noted.  Chest:     Chest wall: No tenderness.  Lymphadenopathy:     Cervical: No cervical adenopathy.  Skin:    Coloration: Skin is not pale.     Findings: No abrasion, erythema, petechiae or rash. Rash is not papular, urticarial or vesicular.  Neurological:     Mental Status: He is alert.  Psychiatric:        Behavior: Behavior is cooperative.      Diagnostic studies:    Spirometry: results abnormal (FEV1: 3.03/62%, FVC: 3.96/63%, FEV1/FVC: 77%).    Spirometry consistent with possible restrictive disease.  This is better than his last spirometry.  He previously had an FEV1 of 2.61 L and an FVC of 3.46 L.  Allergy Studies: none     Marty Shaggy, MD  [facility] 

## 2024-02-26 NOTE — Patient Instructions (Addendum)
 1. Severe persistent asthma, uncomplicated - Lung testing was stable in the mid 60%.  - We can consider changing to Tezspire to see if this helps (injection every month). - Information sent via MyChart about this medication.  - Daily controller medication(s): NOTHING - Prior to physical activity: albuterol 2 puffs 10-15 minutes before physical activity. - Rescue medications: albuterol 4 puffs every 4-6 hours as needed or albuterol nebulizer one vial every 4-6 hours as needed - Changes during respiratory infections or worsening symptoms: Add on Trelegy 200mcg 1 puff twice daily for TWO WEEKS. - Asthma control goals:  * Full participation in all desired activities (may need albuterol before activity) * Albuterol use two time or less a week on average (not counting use with activity) * Cough interfering with sleep two time or less a month * Oral steroids no more than once a year * No hospitalizations  2. Chronic rhinitis (trees, ragweed, dust mites) - with nasal polyps - You seem to be doing well without medications. - Continue with Dupixent , unless you want to change to Tezspire.   3. Return in about 1 year (around 02/25/2025). You can have the follow up appointment with Dr. Iva or a Nurse Practicioner (our Nurse Practitioners are excellent and always have Physician oversight!).    Please inform us  of any Emergency Department visits, hospitalizations, or changes in symptoms. Call us  before going to the ED for breathing or allergy symptoms since we might be able to fit you in for a sick visit. Feel free to contact us  anytime with any questions, problems, or concerns.  It was a pleasure to see you again today!  Websites that have reliable patient information: 1. American Academy of Asthma, Allergy, and Immunology: www.aaaai.org 2. Food Allergy Research and Education (FARE): foodallergy.org 3. Mothers of Asthmatics: http://www.asthmacommunitynetwork.org 4. American College of Allergy,  Asthma, and Immunology: www.acaai.org      "Like" us  on Facebook and Instagram for our latest updates!      A healthy democracy works best when [employer] participate! Make sure you are registered to vote! If you have moved or changed any of your contact information, you will need to get this updated before voting! Scan the QR codes below to learn more!

## 2024-02-28 ENCOUNTER — Encounter: Payer: Self-pay | Admitting: Allergy & Immunology

## 2025-02-24 ENCOUNTER — Ambulatory Visit: Admitting: Allergy & Immunology
